# Patient Record
Sex: Female | Born: 1960 | Hispanic: No | Marital: Married | Smoking: Never smoker
Health system: Southern US, Community
[De-identification: ages and names within clinical notes are randomized; demographics above are authoritative.]

## PROBLEM LIST (undated history)

## (undated) DIAGNOSIS — I10 Essential (primary) hypertension: Secondary | ICD-10-CM

## (undated) DIAGNOSIS — E7800 Pure hypercholesterolemia, unspecified: Secondary | ICD-10-CM

## (undated) HISTORY — PX: EYE SURGERY: SHX253

---

## 2005-04-11 ENCOUNTER — Emergency Department (HOSPITAL_COMMUNITY): Admission: EM | Admit: 2005-04-11 | Discharge: 2005-04-11 | Payer: Self-pay | Admitting: Emergency Medicine

## 2012-08-07 ENCOUNTER — Other Ambulatory Visit: Payer: Self-pay | Admitting: Internal Medicine

## 2012-08-07 DIAGNOSIS — Z1231 Encounter for screening mammogram for malignant neoplasm of breast: Secondary | ICD-10-CM

## 2012-08-08 ENCOUNTER — Ambulatory Visit
Admission: RE | Admit: 2012-08-08 | Discharge: 2012-08-08 | Disposition: A | Discharge status: Home / Self Care | Payer: BC Managed Care – PPO | Source: Ambulatory Visit | Attending: Internal Medicine | Admitting: Internal Medicine

## 2012-08-08 DIAGNOSIS — Z1231 Encounter for screening mammogram for malignant neoplasm of breast: Secondary | ICD-10-CM

## 2013-02-01 DIAGNOSIS — R059 Cough, unspecified: Secondary | ICD-10-CM | POA: Insufficient documentation

## 2013-02-01 DIAGNOSIS — I10 Essential (primary) hypertension: Secondary | ICD-10-CM | POA: Insufficient documentation

## 2013-02-01 DIAGNOSIS — Z79899 Other long term (current) drug therapy: Secondary | ICD-10-CM | POA: Insufficient documentation

## 2013-02-01 DIAGNOSIS — N390 Urinary tract infection, site not specified: Secondary | ICD-10-CM | POA: Insufficient documentation

## 2013-02-01 DIAGNOSIS — R05 Cough: Secondary | ICD-10-CM | POA: Insufficient documentation

## 2013-02-01 DIAGNOSIS — R1110 Vomiting, unspecified: Secondary | ICD-10-CM | POA: Insufficient documentation

## 2013-02-01 NOTE — ED Notes (Signed)
Pt's family reports to EMS-fever, chills and vomited x 3 since 2300.  Pt is ambulatory.  Pt speaks [place], daughter translating.

## 2013-02-02 ENCOUNTER — Emergency Department (HOSPITAL_COMMUNITY): Payer: BC Managed Care – PPO

## 2013-02-02 ENCOUNTER — Encounter (HOSPITAL_COMMUNITY): Payer: Self-pay | Admitting: Emergency Medicine

## 2013-02-02 ENCOUNTER — Emergency Department (HOSPITAL_COMMUNITY)
Admission: EM | Admit: 2013-02-02 | Discharge: 2013-02-02 | Disposition: A | Discharge status: Home / Self Care | Payer: BC Managed Care – PPO | Attending: Emergency Medicine | Admitting: Emergency Medicine

## 2013-02-02 DIAGNOSIS — N390 Urinary tract infection, site not specified: Secondary | ICD-10-CM

## 2013-02-02 HISTORY — DX: Essential (primary) hypertension: I10

## 2013-02-02 LAB — COMPREHENSIVE METABOLIC PANEL
ALBUMIN: 4 g/dL (ref 3.5–5.2)
ALK PHOS: 97 U/L (ref 39–117)
ALT: 19 U/L (ref 0–35)
AST: 29 U/L (ref 0–37)
BUN: 14 mg/dL (ref 6–23)
CALCIUM: 9.1 mg/dL (ref 8.4–10.5)
CHLORIDE: 100 meq/L (ref 96–112)
CO2: 26 meq/L (ref 19–32)
CREATININE: 0.8 mg/dL (ref 0.50–1.10)
GFR calc Af Amer: >90 mL/min (ref >90)
GFR, EST NON AFRICAN AMERICAN: 81 mL/min — AB (ref >90)
GLUCOSE: 112 mg/dL — AB (ref 70–99)
POTASSIUM: 3.8 meq/L (ref 3.7–5.3)
SODIUM: 138 meq/L (ref 137–147)
TOTAL PROTEIN: 7.4 g/dL (ref 6.0–8.3)
Total Bilirubin: 0.4 mg/dL (ref 0.3–1.2)

## 2013-02-02 LAB — CBC WITH DIFFERENTIAL/PLATELET
BASOS ABS: 0 10*3/uL (ref 0.0–0.1)
BASOS PCT: 0 % (ref 0–1)
Eosinophils Absolute: 0.1 10*3/uL (ref 0.0–0.7)
Eosinophils Relative: 1 % (ref 0–5)
HCT: 36.3 % (ref 36.0–46.0)
HEMOGLOBIN: 12.6 g/dL (ref 12.0–15.0)
Lymphocytes Relative: 12 % (ref 12–46)
Lymphs Abs: 0.9 10*3/uL (ref 0.7–4.0)
MCH: 31 pg (ref 26.0–34.0)
MCHC: 34.7 g/dL (ref 30.0–36.0)
MCV: 89.2 fL (ref 78.0–100.0)
Monocytes Absolute: 0.1 10*3/uL (ref 0.1–1.0)
Monocytes Relative: 2 % — ABNORMAL LOW (ref 3–12)
Neutro Abs: 6.9 10*3/uL (ref 1.7–7.7)
Neutrophils Relative %: 86 % — ABNORMAL HIGH (ref 43–77)
PLATELETS: 234 10*3/uL (ref 150–400)
RBC: 4.07 MIL/uL (ref 3.87–5.11)
RDW: 12 % (ref 11.5–15.5)
WBC: 8.1 10*3/uL (ref 4.0–10.5)

## 2013-02-02 LAB — URINALYSIS, ROUTINE W REFLEX MICROSCOPIC
Bilirubin Urine: NEGATIVE
Glucose, UA: NEGATIVE mg/dL
HGB URINE DIPSTICK: NEGATIVE
Ketones, ur: NEGATIVE mg/dL
Nitrite: NEGATIVE
PROTEIN: NEGATIVE mg/dL
Specific Gravity, Urine: 1.006 (ref 1.005–1.030)
UROBILINOGEN UA: 0.2 mg/dL (ref 0.0–1.0)
pH: 7 (ref 5.0–8.0)

## 2013-02-02 LAB — URINE MICROSCOPIC-ADD ON

## 2013-02-02 MED ORDER — ACETAMINOPHEN 325 MG PO TABS
650.0000 mg | ORAL_TABLET | Freq: Once | ORAL | Status: AC
  Administered 2013-02-02: 650 mg via ORAL
  Filled 2013-02-02: qty 2

## 2013-02-02 MED ORDER — PANTOPRAZOLE SODIUM 40 MG IV SOLR
40.0000 mg | Freq: Once | INTRAVENOUS | Status: AC
  Administered 2013-02-02: 40 mg via INTRAVENOUS
  Filled 2013-02-02: qty 40

## 2013-02-02 MED ORDER — CEPHALEXIN 500 MG PO CAPS
1000.0000 mg | ORAL_CAPSULE | Freq: Once | ORAL | Status: AC
  Administered 2013-02-02: 1000 mg via ORAL
  Filled 2013-02-02: qty 2

## 2013-02-02 MED ORDER — SODIUM CHLORIDE 0.9 % IV BOLUS (SEPSIS)
1000.0000 mL | Freq: Once | INTRAVENOUS | Status: AC
  Administered 2013-02-02: 1000 mL via INTRAVENOUS

## 2013-02-02 MED ORDER — SODIUM CHLORIDE 0.9 % IV SOLN
1000.0000 mL | Freq: Once | INTRAVENOUS | Status: AC
  Administered 2013-02-02: 1000 mL via INTRAVENOUS

## 2013-02-02 MED ORDER — CEPHALEXIN 500 MG PO CAPS: 1000.0000 mg | ORAL_CAPSULE | Freq: Two times a day (BID) | ORAL | Status: DC

## 2013-02-02 MED ORDER — ONDANSETRON HCL 8 MG PO TABS: 8.0000 mg | ORAL_TABLET | Freq: Three times a day (TID) | ORAL | Status: DC | PRN

## 2013-02-02 MED ORDER — GI COCKTAIL ~~LOC~~
30.0000 mL | Freq: Once | ORAL | Status: AC
  Administered 2013-02-02: 30 mL via ORAL
  Filled 2013-02-02: qty 30

## 2013-02-02 NOTE — ED Notes (Signed)
Per family pt has not been able to keep anything down for 3 days.  Pt c/o headache and epigastric discomfort.

## 2013-02-02 NOTE — Discharge Instructions (Signed)
Take antibiotic as prescribed.  Take zofran as needed for nausea.  Treat pain and/or fever w/ motrin or tylenol.  You can alternate these two medications every three hours if necessary.  You should return to the ER if you develop fever, severe abdomen or low back pain or uncontrolled vomiting.

## 2013-02-02 NOTE — ED Provider Notes (Signed)
Medical screening examination/treatment/procedure(s) were performed by non-physician practitioner and as supervising physician I was immediately available for consultation/collaboration.  EKG Interpretation   None        Trinnity Breunig R. Mariama Saintvil, MD 02/02/13 0738 

## 2013-02-02 NOTE — ED Notes (Signed)
Bed: WA09 Expected date:  Expected time:  Means of arrival:  Comments: EMS 

## 2013-02-02 NOTE — ED Provider Notes (Signed)
CSN: [id number]     Arrival date & time 02/01/13  2358 History   First MD Initiated Contact with Patient 02/02/13 0023     Chief Complaint  Patient presents with  . fever,chills   . Emesis   (Consider location/radiation/quality/duration/timing/severity/associated sxs/prior Treatment) HPI History provided by pt and her daughter who is interpreting.  Pt presents w/ multiple complaints.  Onset N/V 3 days ago.  5 episodes day 1, 2-3 episodes day 2 and none yesterday.  Appetite returned yesterday and she was able to tolerate pos.  Woke this morning with fever and chills.  Associated w/ pain across lower abdomen as well as L low back, dysuria and nocturia.  Has also had a cough for 2-3 months, worst at night, that worsened yesterday.  Denies SOB but has pain in center of her chest w/ palpation.  Denies nasal congestion, rhinorrhea and sore throat.  Recent headache but no pain currently. Denies change in bowels and vaginal sx.  No known sick contacts.  H/o HTN. Past Medical History  Diagnosis Date  . Hypertension    History reviewed. No pertinent past surgical history. No family history on file. History  Substance Use Topics  . Smoking status: Never Smoker   . Smokeless tobacco: Not on file  . Alcohol Use: No   OB History   Grav Para Term Preterm Abortions TAB SAB Ect Mult Living                 Review of Systems  All other systems reviewed and are negative.    Allergies  Review of patient's allergies indicates no known allergies.  Home Medications   Current Outpatient Rx  Name  Route  Sig  Dispense  Refill  . acetaminophen (TYLENOL) 500 MG tablet   Oral   Take 500-1,000 mg by mouth every 6 (six) hours as needed for mild pain or headache.         . bismuth subsalicylate (PEPTO BISMOL) 262 MG/15ML suspension   Oral   Take 30 mLs by mouth every 6 (six) hours as needed for indigestion or diarrhea or loose stools.         . calcium carbonate (TUMS - DOSED IN MG ELEMENTAL  CALCIUM) 500 MG chewable tablet   Oral   Chew 1 tablet by mouth 3 (three) times daily as needed for indigestion or heartburn.         Marland Kitchen lisinopril (PRINIVIL,ZESTRIL) 20 MG tablet   Oral   Take 20 mg by mouth every evening.          BP 146/83  Pulse 96  Temp(Src) 102.6 F (39.2 C) (Rectal)  Resp 20  SpO2 93% Physical Exam  Nursing note and vitals reviewed. Constitutional: She is oriented to person, place, and time. She appears well-developed and well-nourished. No distress.  febrile  HENT:  Head: Normocephalic and atraumatic.  Eyes:  Normal appearance  Neck: Normal range of motion.  No meningismus   Cardiovascular: Normal rate and regular rhythm.   Pulmonary/Chest: Effort normal and breath sounds normal. No respiratory distress.  Abdominal: Soft. Bowel sounds are normal. She exhibits no distension and no mass. There is no rebound and no guarding.  Mild epigastric and moderate, diffuse lower abd ttp.    Genitourinary:  L CVA tenderness  Musculoskeletal: Normal range of motion.  Neurological: She is alert and oriented to person, place, and time.  CN 3-12 intact.  No sensory deficits.  5/5 and equal upper and lower extremity strength.  No past pointing.   Skin: Skin is warm and dry. No rash noted.  Psychiatric: She has a normal mood and affect. Her behavior is normal.    ED Course  Procedures (including critical care time) Labs Review Labs Reviewed  CBC WITH DIFFERENTIAL - Abnormal; Notable for the following:    Neutrophils Relative % 86 (*)    Monocytes Relative 2 (*)    All other components within normal limits  COMPREHENSIVE METABOLIC PANEL - Abnormal; Notable for the following:    Glucose, Bld 112 (*)    GFR calc non Af Amer 81 (*)    All other components within normal limits  URINALYSIS, ROUTINE W REFLEX MICROSCOPIC   Imaging Review No results found.  EKG Interpretation   None       MDM  No diagnosis found. 53yo F presents w/ f/c, vomiting, lower  abd pain, urinary sx and acutely worsened chronic cough.  On exam, febrile, elevated HR, non-toxic appearance, nml breath sounds, abd soft/non-distended, mild epigastric and moderate, diffuse lower abd ttp, L flank ttp, no focal neuro deficits or meningismus.  Labs unremarkable w/ exception of U/A which is pending.  CXR ordered to r/o PNA.  Pt to receive 2nd liter bolus NS as well as GI cocktail and protonix.  She has had tylenol for fever.  1:38 AM   CXR w/out infiltrate or other abnormality. U/A shows likely infection and have some concern for pyelo based on L CVA tenderness, recent vomiting and fever.  Pt reports feeling better.  VS have improved. She received first dose of keflex in ED and d/c'd home w/ same + zofran.  Recommended rest and fluids.  Return precautions discussed.     Otilio Miuatherine E Teralyn Mullins, PA-C 02/02/13 [phone]

## 2013-02-04 LAB — URINE CULTURE

## 2013-04-26 ENCOUNTER — Emergency Department (HOSPITAL_COMMUNITY)
Admission: EM | Admit: 2013-04-26 | Discharge: 2013-04-26 | Disposition: A | Discharge status: Home / Self Care | Payer: BC Managed Care – PPO | Attending: Emergency Medicine | Admitting: Emergency Medicine

## 2013-04-26 ENCOUNTER — Encounter (HOSPITAL_COMMUNITY): Payer: Self-pay | Admitting: Emergency Medicine

## 2013-04-26 ENCOUNTER — Emergency Department (HOSPITAL_COMMUNITY): Payer: BC Managed Care – PPO

## 2013-04-26 DIAGNOSIS — I10 Essential (primary) hypertension: Secondary | ICD-10-CM

## 2013-04-26 DIAGNOSIS — Z79899 Other long term (current) drug therapy: Secondary | ICD-10-CM | POA: Insufficient documentation

## 2013-04-26 DIAGNOSIS — J3489 Other specified disorders of nose and nasal sinuses: Secondary | ICD-10-CM | POA: Insufficient documentation

## 2013-04-26 DIAGNOSIS — R51 Headache: Secondary | ICD-10-CM | POA: Insufficient documentation

## 2013-04-26 DIAGNOSIS — R519 Headache, unspecified: Secondary | ICD-10-CM

## 2013-04-26 DIAGNOSIS — Z792 Long term (current) use of antibiotics: Secondary | ICD-10-CM | POA: Insufficient documentation

## 2013-04-26 LAB — BASIC METABOLIC PANEL
BUN: 13 mg/dL (ref 6–23)
CHLORIDE: 99 meq/L (ref 96–112)
CO2: 24 mEq/L (ref 19–32)
Calcium: 9.4 mg/dL (ref 8.4–10.5)
Creatinine, Ser: 0.65 mg/dL (ref 0.50–1.10)
GFR calc non Af Amer: >90 mL/min (ref >90)
Glucose, Bld: 104 mg/dL — ABNORMAL HIGH (ref 70–99)
Potassium: 3.7 mEq/L (ref 3.7–5.3)
SODIUM: 136 meq/L — AB (ref 137–147)

## 2013-04-26 LAB — CBC
HEMATOCRIT: 37.5 % (ref 36.0–46.0)
Hemoglobin: 12.9 g/dL (ref 12.0–15.0)
MCH: 30.6 pg (ref 26.0–34.0)
MCHC: 34.4 g/dL (ref 30.0–36.0)
MCV: 89.1 fL (ref 78.0–100.0)
Platelets: 225 10*3/uL (ref 150–400)
RBC: 4.21 MIL/uL (ref 3.87–5.11)
RDW: 12.4 % (ref 11.5–15.5)
WBC: 4.8 10*3/uL (ref 4.0–10.5)

## 2013-04-26 MED ORDER — TRAMADOL HCL 50 MG PO TABS
50.0000 mg | ORAL_TABLET | Freq: Once | ORAL | Status: AC
  Administered 2013-04-26: 50 mg via ORAL
  Filled 2013-04-26: qty 1

## 2013-04-26 MED ORDER — LISINOPRIL 20 MG PO TABS: 20.0000 mg | ORAL_TABLET | Freq: Every day | ORAL | Status: AC

## 2013-04-26 MED ORDER — LISINOPRIL 20 MG PO TABS
20.0000 mg | ORAL_TABLET | Freq: Once | ORAL | Status: AC
  Administered 2013-04-26: 20 mg via ORAL
  Filled 2013-04-26: qty 1

## 2013-04-26 MED ORDER — ONDANSETRON HCL 4 MG/2ML IJ SOLN: 4.0000 mg | Freq: Once | INTRAMUSCULAR | Status: DC

## 2013-04-26 NOTE — ED Notes (Signed)
Patient was showering, sudden onset headache and neck tension. Family checked BP at home SBP 200, 190, then 170 at home. 200/100 on arrival.

## 2013-04-26 NOTE — ED Notes (Signed)
Patient transported to CT 

## 2013-04-26 NOTE — ED Provider Notes (Addendum)
CSN: [id number]     Arrival date & time 04/26/13  2134 History   First MD Initiated Contact with Patient 04/26/13 2157     Chief Complaint  Patient presents with  . Hypertension    SBP 200, 190, 170 at home  . Headache     (Consider location/radiation/quality/duration/timing/severity/associated sxs/prior Treatment) Patient is a 53 y.o. female presenting with hypertension and headaches. The history is provided by the patient and a relative. A language interpreter was used.  Hypertension Associated symptoms include headaches. Pertinent negatives include no chest pain, no abdominal pain and no shortness of breath.  Headache Associated symptoms: congestion   Associated symptoms: no abdominal pain, no back pain, no cough, no fever, no nausea, no neck pain, no numbness, no sinus pressure and no vomiting   pt with hx htn, states missed her normal bp med dose today, and checked bp this evening and was high, in range 200/ systolic. Pt also noted headache onset tonight, acute onset, mild-moderate at onset. Located superiorly. Dull. Moderate currently. Similar to prior headaches, ?remote hx migraines. Does not get frequent headaches. No neck stiffness/rigidity. No eye pain or change in vision. No change in speech. No problems w balance or coordination. No nv. No associated numbness/weakness. +recent allergy symptoms. No purulent nasal drainage, or sinus pressure. No fever or chills.      Past Medical History  Diagnosis Date  . Hypertension    History reviewed. No pertinent past surgical history. No family history on file. History  Substance Use Topics  . Smoking status: Never Smoker   . Smokeless tobacco: Not on file  . Alcohol Use: No   OB History   Grav Para Term Preterm Abortions TAB SAB Ect Mult Living                 Review of Systems  Constitutional: Negative for fever and chills.  HENT: Positive for congestion. Negative for sinus pressure.   Eyes: Negative for redness.   Respiratory: Negative for cough and shortness of breath.   Cardiovascular: Negative for chest pain.  Gastrointestinal: Negative for nausea, vomiting and abdominal pain.  Genitourinary: Negative for flank pain.  Musculoskeletal: Negative for back pain and neck pain.  Skin: Negative for rash.  Neurological: Positive for headaches. Negative for syncope, weakness and numbness.  Hematological: Does not bruise/bleed easily.  Psychiatric/Behavioral: Negative for confusion.      Allergies  Review of patient's allergies indicates no known allergies.  Home Medications   Prior to Admission medications   Medication Sig Start Date End Date Taking? Authorizing Provider  acetaminophen (TYLENOL) 500 MG tablet Take 500-1,000 mg by mouth every 6 (six) hours as needed for mild pain or headache.    Historical Provider, MD  bismuth subsalicylate (PEPTO BISMOL) 262 MG/15ML suspension Take 30 mLs by mouth every 6 (six) hours as needed for indigestion or diarrhea or loose stools.    Historical Provider, MD  calcium carbonate (TUMS - DOSED IN MG ELEMENTAL CALCIUM) 500 MG chewable tablet Chew 1 tablet by mouth 3 (three) times daily as needed for indigestion or heartburn.    Historical Provider, MD  cephALEXin (KEFLEX) 500 MG capsule Take 2 capsules (1,000 mg total) by mouth 2 (two) times daily. 02/02/13   Arie Sabina Schinlever, PA-C  lisinopril (PRINIVIL,ZESTRIL) 20 MG tablet Take 20 mg by mouth every evening.    Historical Provider, MD  ondansetron (ZOFRAN) 8 MG tablet Take 1 tablet (8 mg total) by mouth every 8 (eight) hours as needed  for nausea or vomiting. 02/02/13   Arie Sabinaatherine E Schinlever, PA-C   BP 166/89  Pulse 55  Temp(Src) 97.5 F (36.4 C) (Oral)  Resp 14  Ht 5\' 3"  (1.6 m)  Wt 135 lb (61.236 kg)  BMI 23.92 kg/m2  SpO2 100% Physical Exam  Nursing note and vitals reviewed. Constitutional: She is oriented to person, place, and time. She appears well-developed and well-nourished. No distress.  HENT:   Head: Atraumatic.  Nose: Nose normal.  Mouth/Throat: Oropharynx is clear and moist.  No sinus or temporal tenderness.  Eyes: Conjunctivae and EOM are normal. Pupils are equal, round, and reactive to light. No scleral icterus.  Neck: Neck supple. No tracheal deviation present. No thyromegaly present.  No stiffness or rigidity.   Cardiovascular: Normal rate, regular rhythm, normal heart sounds and intact distal pulses.  Exam reveals no gallop and no friction rub.   No murmur heard. Pulmonary/Chest: Effort normal and breath sounds normal. No respiratory distress.  Abdominal: Soft. Normal appearance and bowel sounds are normal. She exhibits no distension. There is no tenderness.  Genitourinary:  No cva tenderness.  Musculoskeletal: Normal range of motion. She exhibits no edema and no tenderness.  Neurological: She is alert and oriented to person, place, and time. No cranial nerve deficit.  Motor intact bilaterally. Steady gait.   Skin: Skin is warm and dry. No rash noted. She is not diaphoretic.  Psychiatric: She has a normal mood and affect.    ED Course  Procedures (including critical care time)   Results for orders placed during the hospital encounter of 04/26/13  CBC      Result Value Ref Range   WBC 4.8  4.0 - 10.5 K/uL   RBC 4.21  3.87 - 5.11 MIL/uL   Hemoglobin 12.9  12.0 - 15.0 g/dL   HCT 78.237.5  95.636.0 - 21.346.0 %   MCV 89.1  78.0 - 100.0 fL   MCH 30.6  26.0 - 34.0 pg   MCHC 34.4  30.0 - 36.0 g/dL   RDW 08.612.4  57.811.5 - 46.915.5 %   Platelets 225  150 - 400 K/uL  BASIC METABOLIC PANEL      Result Value Ref Range   Sodium 136 (*) 137 - 147 mEq/L   Potassium 3.7  3.7 - 5.3 mEq/L   Chloride 99  96 - 112 mEq/L   CO2 24  19 - 32 mEq/L   Glucose, Bld 104 (*) 70 - 99 mg/dL   BUN 13  6 - 23 mg/dL   Creatinine, Ser 6.290.65  0.50 - 1.10 mg/dL   Calcium 9.4  8.4 - 52.810.5 mg/dL   GFR calc non Af Amer >90  >90 mL/min   GFR calc Af Amer >90  >90 mL/min   Ct Head Wo Contrast  04/26/2013    CLINICAL DATA:  Sudden onset headache  EXAM: CT HEAD WITHOUT CONTRAST  TECHNIQUE: Contiguous axial images were obtained from the base of the skull through the vertex without intravenous contrast.  COMPARISON:  Prior CT from 04/11/2005.  FINDINGS: There is no acute intracranial hemorrhage or infarct. No mass lesion or midline shift. Gray-white matter differentiation is well maintained. Ventricles are normal in size without evidence of hydrocephalus. CSF containing spaces are within normal limits. No extra-axial fluid collection.  The calvarium is intact.  Orbital soft tissues are within normal limits.  The paranasal sinuses and mastoid air cells are well pneumatized and free of fluid.  Scalp soft tissues are unremarkable.  IMPRESSION: Normal  head CT with no acute intracranial process identified.   Electronically Signed   By: Rise MuBenjamin  McClintock M.D.   On: 04/26/2013 22:44        EKG Interpretation   Date/Time:  Saturday April 26 2013 22:07:14 EDT Ventricular Rate:  55 PR Interval:  145 QRS Duration: 102 QT Interval:  433 QTC Calculation: 414 R Axis:   45 Text Interpretation:  Sinus rhythm No previous tracing Confirmed by Denton LankSTEINL   MD, Caryn BeeKEVIN ([id number]) on 04/26/2013 10:12:31 PM      MDM  Pt missed her normal bp med today. Recheck bp high. Will give her normal medication dose.  Reviewed nursing notes and prior charts for additional history.   Ultram po.  Recheck pt comfortable. bp improved.   Headache improved/resolved. bp improved. Ct neg. Pt appears stable for d/c.   Pt/family states has a few of her bp pills at home, but almost out, requests refill.      Suzi RootsKevin E Rynell Ciotti, MD 04/26/13 [phone]

## 2013-04-26 NOTE — Discharge Instructions (Signed)
Continue your blood pressure medication as prescribed. Take tylenol/motrin as need.  Follow up with primary care doctor in coming week for recheck/recheck of blood pressure. Return to ER if worse, severe headache, persistent vomiting, other concern.  You were given pain medication in the ER - no driving for the next 4 hours    Hypertension As your heart beats, it forces blood through your arteries. This force is your blood pressure. If the pressure is too high, it is called hypertension (HTN) or high blood pressure. HTN is dangerous because you may have it and not know it. High blood pressure may mean that your heart has to work harder to pump blood. Your arteries may be narrow or stiff. The extra work puts you at risk for heart disease, stroke, and other problems.  Blood pressure consists of two numbers, a higher number over a lower, 110/72, for example. It is stated as "110 over 72." The ideal is below 120 for the top number (systolic) and under 80 for the bottom (diastolic). Write down your blood pressure today. You should pay close attention to your blood pressure if you have certain conditions such as:  Heart failure.  Prior heart attack.  Diabetes  Chronic kidney disease.  Prior stroke.  Multiple risk factors for heart disease. To see if you have HTN, your blood pressure should be measured while you are seated with your arm held at the level of the heart. It should be measured at least twice. A one-time elevated blood pressure reading (especially in the Emergency Department) does not mean that you need treatment. There may be conditions in which the blood pressure is different between your right and left arms. It is important to see your caregiver soon for a recheck. Most people have essential hypertension which means that there is not a specific cause. This type of high blood pressure may be lowered by changing lifestyle factors such as:  Stress.  Smoking.  Lack of  exercise.  Excessive weight.  Drug/tobacco/alcohol use.  Eating less salt. Most people do not have symptoms from high blood pressure until it has caused damage to the body. Effective treatment can often prevent, delay or reduce that damage. TREATMENT  When a cause has been identified, treatment for high blood pressure is directed at the cause. There are a large number of medications to treat HTN. These fall into several categories, and your caregiver will help you select the medicines that are best for you. Medications may have side effects. You should review side effects with your caregiver. If your blood pressure stays high after you have made lifestyle changes or started on medicines,   Your medication(s) may need to be changed.  Other problems may need to be addressed.  Be certain you understand your prescriptions, and know how and when to take your medicine.  Be sure to follow up with your caregiver within the time frame advised (usually within two weeks) to have your blood pressure rechecked and to review your medications.  If you are taking more than one medicine to lower your blood pressure, make sure you know how and at what times they should be taken. Taking two medicines at the same time can result in blood pressure that is too low. SEEK IMMEDIATE MEDICAL CARE IF:  You develop a severe headache, blurred or changing vision, or confusion.  You have unusual weakness or numbness, or a faint feeling.  You have severe chest or abdominal pain, vomiting, or breathing problems. MAKE SURE  YOU:   Understand these instructions.  Will watch your condition.  Will get help right away if you are not doing well or get worse. Document Released: 12/19/2004 Document Revised: 03/13/2011 Document Reviewed: 08/09/2007 [facility] Patient Information 2014 [place], [place].    Headaches, Frequently Asked Questions MIGRAINE HEADACHES Q: What is migraine? What causes it? How can I treat it? A:  Generally, migraine headaches begin as a dull ache. Then they develop into a constant, throbbing, and pulsating pain. You may experience pain at the temples. You may experience pain at the front or back of one or both sides of the head. The pain is usually accompanied by a combination of:  Nausea.  Vomiting.  Sensitivity to light and noise. Some people (about 15%) experience an aura (see below) before an attack. The cause of migraine is believed to be chemical reactions in the brain. Treatment for migraine may include over-the-counter or prescription medications. It may also include self-help techniques. These include relaxation training and biofeedback.  Q: What is an aura? A: About 15% of people with migraine get an "aura". This is a sign of neurological symptoms that occur before a migraine headache. You may see wavy or jagged lines, dots, or flashing lights. You might experience tunnel vision or blind spots in one or both eyes. The aura can include visual or auditory hallucinations (something imagined). It may include disruptions in smell (such as strange odors), taste or touch. Other symptoms include:  Numbness.  A "pins and needles" sensation.  Difficulty in recalling or speaking the correct word. These neurological events may last as long as 60 minutes. These symptoms will fade as the headache begins. Q: What is a trigger? A: Certain physical or environmental factors can lead to or "trigger" a migraine. These include:  Foods.  Hormonal changes.  Weather.  Stress. It is important to remember that triggers are different for everyone. To help prevent migraine attacks, you need to figure out which triggers affect you. Keep a headache diary. This is a good way to track triggers. The diary will help you talk to your healthcare professional about your condition. Q: Does weather affect migraines? A: Bright sunshine, hot, humid conditions, and drastic changes in barometric pressure may  lead to, or "trigger," a migraine attack in some people. But studies have shown that weather does not act as a trigger for everyone with migraines. Q: What is the link between migraine and hormones? A: Hormones start and regulate many of your body's functions. Hormones keep your body in balance within a constantly changing environment. The levels of hormones in your body are unbalanced at times. Examples are during menstruation, pregnancy, or menopause. That can lead to a migraine attack. In fact, about three quarters of all women with migraine report that their attacks are related to the menstrual cycle.  Q: Is there an increased risk of stroke for migraine sufferers? A: The likelihood of a migraine attack causing a stroke is very remote. That is not to say that migraine sufferers cannot have a stroke associated with their migraines. In persons under age 55, the most common associated factor for stroke is migraine headache. But over the course of a person's normal life span, the occurrence of migraine headache may actually be associated with a reduced risk of dying from cerebrovascular disease due to stroke.  Q: What are acute medications for migraine? A: Acute medications are used to treat the pain of the headache after it has started. Examples over-the-counter medications, NSAIDs, ergots,  and triptans.  Q: What are the triptans? A: Triptans are the newest class of abortive medications. They are specifically targeted to treat migraine. Triptans are vasoconstrictors. They moderate some chemical reactions in the brain. The triptans work on receptors in your brain. Triptans help to restore the balance of a neurotransmitter called serotonin. Fluctuations in levels of serotonin are thought to be a main cause of migraine.  Q: Are over-the-counter medications for migraine effective? A: Over-the-counter, or "OTC," medications may be effective in relieving mild to moderate pain and associated symptoms of  migraine. But you should see your caregiver before beginning any treatment regimen for migraine.  Q: What are preventive medications for migraine? A: Preventive medications for migraine are sometimes referred to as "prophylactic" treatments. They are used to reduce the frequency, severity, and length of migraine attacks. Examples of preventive medications include antiepileptic medications, antidepressants, beta-blockers, calcium channel blockers, and NSAIDs (nonsteroidal anti-inflammatory drugs). Q: Why are anticonvulsants used to treat migraine? A: During the past few years, there has been an increased interest in antiepileptic drugs for the prevention of migraine. They are sometimes referred to as "anticonvulsants". Both epilepsy and migraine may be caused by similar reactions in the brain.  Q: Why are antidepressants used to treat migraine? A: Antidepressants are typically used to treat people with depression. They may reduce migraine frequency by regulating chemical levels, such as serotonin, in the brain.  Q: What alternative therapies are used to treat migraine? A: The term "alternative therapies" is often used to describe treatments considered outside the scope of conventional Western medicine. Examples of alternative therapy include acupuncture, acupressure, and yoga. Another common alternative treatment is herbal therapy. Some herbs are believed to relieve headache pain. Always discuss alternative therapies with your caregiver before proceeding. Some herbal products contain arsenic and other toxins. TENSION HEADACHES Q: What is a tension-type headache? What causes it? How can I treat it? A: Tension-type headaches occur randomly. They are often the result of temporary stress, anxiety, fatigue, or anger. Symptoms include soreness in your temples, a tightening band-like sensation around your head (a "vice-like" ache). Symptoms can also include a pulling feeling, pressure sensations, and contracting  head and neck muscles. The headache begins in your forehead, temples, or the back of your head and neck. Treatment for tension-type headache may include over-the-counter or prescription medications. Treatment may also include self-help techniques such as relaxation training and biofeedback. CLUSTER HEADACHES Q: What is a cluster headache? What causes it? How can I treat it? A: Cluster headache gets its name because the attacks come in groups. The pain arrives with little, if any, warning. It is usually on one side of the head. A tearing or bloodshot eye and a runny nose on the same side of the headache may also accompany the pain. Cluster headaches are believed to be caused by chemical reactions in the brain. They have been described as the most severe and intense of any headache type. Treatment for cluster headache includes prescription medication and oxygen. SINUS HEADACHES Q: What is a sinus headache? What causes it? How can I treat it? A: When a cavity in the bones of the face and skull (a sinus) becomes inflamed, the inflammation will cause localized pain. This condition is usually the result of an allergic reaction, a tumor, or an infection. If your headache is caused by a sinus blockage, such as an infection, you will probably have a fever. An x-ray will confirm a sinus blockage. Your caregiver's treatment might include antibiotics  for the infection, as well as antihistamines or decongestants.  REBOUND HEADACHES Q: What is a rebound headache? What causes it? How can I treat it? A: A pattern of taking acute headache medications too often can lead to a condition known as "rebound headache." A pattern of taking too much headache medication includes taking it more than 2 days per week or in excessive amounts. That means more than the label or a caregiver advises. With rebound headaches, your medications not only stop relieving pain, they actually begin to cause headaches. Doctors treat rebound headache  by tapering the medication that is being overused. Sometimes your caregiver will gradually substitute a different type of treatment or medication. Stopping may be a challenge. Regularly overusing a medication increases the potential for serious side effects. Consult a caregiver if you regularly use headache medications more than 2 days per week or more than the label advises. ADDITIONAL QUESTIONS AND ANSWERS Q: What is biofeedback? A: Biofeedback is a self-help treatment. Biofeedback uses special equipment to monitor your body's involuntary physical responses. Biofeedback monitors:  Breathing.  Pulse.  Heart rate.  Temperature.  Muscle tension.  Brain activity. Biofeedback helps you refine and perfect your relaxation exercises. You learn to control the physical responses that are related to stress. Once the technique has been mastered, you do not need the equipment any more. Q: Are headaches hereditary? A: Four out of five (80%) of people that suffer report a family history of migraine. Scientists are not sure if this is genetic or a family predisposition. Despite the uncertainty, a child has a 50% chance of having migraine if one parent suffers. The child has a 75% chance if both parents suffer.  Q: Can children get headaches? A: By the time they reach high school, most young people have experienced some type of headache. Many safe and effective approaches or medications can prevent a headache from occurring or stop it after it has begun.  Q: What type of doctor should I see to diagnose and treat my headache? A: Start with your primary caregiver. Discuss his or her experience and approach to headaches. Discuss methods of classification, diagnosis, and treatment. Your caregiver may decide to recommend you to a headache specialist, depending upon your symptoms or other physical conditions. Having diabetes, allergies, etc., may require a more comprehensive and inclusive approach to your headache.  The [employer] will provide, upon request, a list of [facility] physician members in your state. Document Released: 03/11/2003 Document Revised: 03/13/2011 Document Reviewed: 08/19/2007 [facility] Patient Information 2014 [place], [place].

## 2013-04-26 NOTE — ED Notes (Signed)
Pt returned to room from CT

## 2013-04-26 NOTE — ED Notes (Signed)
Called pharmacy to send Lisinopril

## 2013-09-15 ENCOUNTER — Other Ambulatory Visit: Payer: Self-pay | Admitting: Internal Medicine

## 2013-09-15 DIAGNOSIS — Z1231 Encounter for screening mammogram for malignant neoplasm of breast: Secondary | ICD-10-CM

## 2013-09-22 ENCOUNTER — Ambulatory Visit: Payer: BC Managed Care – PPO

## 2013-11-07 ENCOUNTER — Ambulatory Visit (INDEPENDENT_AMBULATORY_CARE_PROVIDER_SITE_OTHER): Payer: BC Managed Care – PPO | Admitting: Family Medicine

## 2013-11-07 VITALS — BP 124/74 | HR 57 | Temp 97.9°F | Resp 16 | Ht 62.5 in | Wt 138.4 lb

## 2013-11-07 DIAGNOSIS — R05 Cough: Secondary | ICD-10-CM

## 2013-11-07 DIAGNOSIS — R079 Chest pain, unspecified: Secondary | ICD-10-CM

## 2013-11-07 DIAGNOSIS — B349 Viral infection, unspecified: Secondary | ICD-10-CM

## 2013-11-07 DIAGNOSIS — R519 Headache, unspecified: Secondary | ICD-10-CM

## 2013-11-07 DIAGNOSIS — R059 Cough, unspecified: Secondary | ICD-10-CM

## 2013-11-07 DIAGNOSIS — M791 Myalgia, unspecified site: Secondary | ICD-10-CM

## 2013-11-07 DIAGNOSIS — R42 Dizziness and giddiness: Secondary | ICD-10-CM

## 2013-11-07 DIAGNOSIS — R51 Headache: Secondary | ICD-10-CM

## 2013-11-07 DIAGNOSIS — L299 Pruritus, unspecified: Secondary | ICD-10-CM

## 2013-11-07 LAB — POCT CBC
GRANULOCYTE PERCENT: 73.5 % (ref 37–80)
HEMATOCRIT: 42.4 % (ref 37.7–47.9)
HEMOGLOBIN: 14.3 g/dL (ref 12.2–16.2)
LYMPH, POC: 1.4 (ref 0.6–3.4)
MCH, POC: 30.7 pg (ref 27–31.2)
MCHC: 33.9 g/dL (ref 31.8–35.4)
MCV: 90.6 fL (ref 80–97)
MID (cbc): 0.4 (ref 0–0.9)
MPV: 6.8 fL (ref 0–99.8)
POC GRANULOCYTE: 5 (ref 2–6.9)
POC LYMPH %: 21.2 % (ref 10–50)
POC MID %: 5.3 % (ref 0–12)
Platelet Count, POC: 252 10*3/uL (ref 142–424)
RBC: 4.68 M/uL (ref 4.04–5.48)
RDW, POC: 12.6 %
WBC: 6.8 10*3/uL (ref 4.6–10.2)

## 2013-11-07 LAB — POCT INFLUENZA A/B
INFLUENZA B, POC: NEGATIVE
Influenza A, POC: NEGATIVE

## 2013-11-07 MED ORDER — BENZONATATE 100 MG PO CAPS: 100.0000 mg | ORAL_CAPSULE | Freq: Three times a day (TID) | ORAL | Status: DC | PRN

## 2013-11-07 NOTE — Patient Instructions (Addendum)
Your itching may be due to the hydrocodone in the cough medicine. Stop that medicine for now, tessalon perles if needed for cough, and if you do use the hydrocodone again in the future and it causes itching - avoid use of this class of medicines in the future.   Your blood test and flu test were normal. I suspect you have a viral illness, that should improve in next 5-7 days. Tylenol if needed for body aches and headaches, mucinex or tessalon perles for cough. Drink plenty of fluids, and if needed - can take zyrtec over the counter once per day for itching.   Your chest soreness appears to be due to muscles/chest wall, but if this worsens - return here or emergency room.   If dizziness does not improve in next 2 days with increasing your fluids - can return to look at other causes of this as well.   Return to the clinic or go to the nearest emergency room if any of your symptoms worsen or new symptoms occur.   Viral Infections A virus is a type of germ. Viruses can cause:  Minor sore throats.  Aches and pains.  Headaches.  Runny nose.  Rashes.  Watery eyes.  Tiredness.  Coughs.  Loss of appetite.  Feeling sick to your stomach (nausea).  Throwing up (vomiting).  Watery poop (diarrhea). HOME CARE   Only take medicines as told by your doctor.  Drink enough water and fluids to keep your pee (urine) clear or pale yellow. Sports drinks are a good choice.  Get plenty of rest and eat healthy. Soups and broths with crackers or rice are fine. GET HELP RIGHT AWAY IF:   You have a very bad headache.  You have shortness of breath.  You have chest pain or neck pain.  You have an unusual rash.  You cannot stop throwing up.  You have watery poop that does not stop.  You cannot keep fluids down.  You or your child has a temperature by mouth above 102 F (38.9 C), not controlled by medicine.  Your baby is older than 3 months with a rectal temperature of 102 F (38.9 C)  or higher.  Your baby is 8 months old or younger with a rectal temperature of 100.4 F (38 C) or higher. MAKE SURE YOU:   Understand these instructions.  Will watch this condition.  Will get help right away if you are not doing well or get worse. Document Released: 12/02/2007 Document Revised: 03/13/2011 Document Reviewed: 04/26/2010 [facility] Patient Information 2015 [place], [place]. This information is not intended to replace advice given to you by your health care provider. Make sure you discuss any questions you have with your health care provider.  Cough, Adult  A cough is a reflex that helps clear your throat and airways. It can help heal the body or may be a reaction to an irritated airway. A cough may only last 2 or 3 weeks (acute) or may last more than 8 weeks (chronic).  CAUSES Acute cough:  Viral or bacterial infections. Chronic cough:  Infections.  Allergies.  Asthma.  Post-nasal drip.  Smoking.  Heartburn or acid reflux.  Some medicines.  Chronic lung problems (COPD).  Cancer. SYMPTOMS   Cough.  Fever.  Chest pain.  Increased breathing rate.  High-pitched whistling sound when breathing (wheezing).  Colored mucus that you cough up (sputum). TREATMENT   A bacterial cough may be treated with antibiotic medicine.  A viral cough must run  its course and will not respond to antibiotics.  Your caregiver may recommend other treatments if you have a chronic cough. HOME CARE INSTRUCTIONS   Only take over-the-counter or prescription medicines for pain, discomfort, or fever as directed by your caregiver. Use cough suppressants only as directed by your caregiver.  Use a cold steam vaporizer or humidifier in your bedroom or home to help loosen secretions.  Sleep in a semi-upright position if your cough is worse at night.  Rest as needed.  Stop smoking if you smoke. SEEK IMMEDIATE MEDICAL CARE IF:   You have pus in your sputum.  Your cough starts  to worsen.  You cannot control your cough with suppressants and are losing sleep.  You begin coughing up blood.  You have difficulty breathing.  You develop pain which is getting worse or is uncontrolled with medicine.  You have a fever. MAKE SURE YOU:   Understand these instructions.  Will watch your condition.  Will get help right away if you are not doing well or get worse. Document Released: 06/17/2010 Document Revised: 03/13/2011 Document Reviewed: 06/17/2010 [facility] Patient Information 2015 [place], [place]. This information is not intended to replace advice given to you by your health care provider. Make sure you discuss any questions you have with your health care provider.  Dizziness Dizziness is a common problem. It is a feeling of unsteadiness or light-headedness. You may feel like you are about to faint. Dizziness can lead to injury if you stumble or fall. A person of any age group can suffer from dizziness, but dizziness is more common in older adults. CAUSES  Dizziness can be caused by many different things, including:  Middle ear problems.  Standing for too long.  Infections.  An allergic reaction.  Aging.  An emotional response to something, such as the sight of blood.  Side effects of medicines.  Tiredness.  Problems with circulation or blood pressure.  Excessive use of alcohol or medicines, or illegal drug use.  Breathing too fast (hyperventilation).  An irregular heart rhythm (arrhythmia).  A low red blood cell count (anemia).  Pregnancy.  Vomiting, diarrhea, fever, or other illnesses that cause body fluid loss (dehydration).  Diseases or conditions such as Parkinson's disease, high blood pressure (hypertension), diabetes, and thyroid problems.  Exposure to extreme heat. DIAGNOSIS  Your health care provider will ask about your symptoms, perform a physical exam, and perform an electrocardiogram (ECG) to record the electrical activity of  your heart. Your health care provider may also perform other heart or blood tests to determine the cause of your dizziness. These may include:  Transthoracic echocardiogram (TTE). During echocardiography, sound waves are used to evaluate how blood flows through your heart.  Transesophageal echocardiogram (TEE).  Cardiac monitoring. This allows your health care provider to monitor your heart rate and rhythm in real time.  Holter monitor. This is a portable device that records your heartbeat and can help diagnose heart arrhythmias. It allows your health care provider to track your heart activity for several days if needed.  Stress tests by exercise or by giving medicine that makes the heart beat faster.  Chest Wall Pain Chest wall pain is pain in or around the bones and muscles of your chest. It may take up to 6 weeks to get better. It may take longer if you must stay physically active in your work and activities.  CAUSES  Chest wall pain may happen on its own. However, it may be caused by:  A  viral illness like the flu.  Injury.  Coughing.  Exercise.  Arthritis.  Fibromyalgia.  Shingles. HOME CARE INSTRUCTIONS   Avoid overtiring physical activity. Try not to strain or perform activities that cause pain. This includes any activities using your chest or your abdominal and side muscles, especially if heavy weights are used.  Put ice on the sore area.  Put ice in a plastic bag.  Place a towel between your skin and the bag.  Leave the ice on for 15-20 minutes per hour while awake for the first 2 days.  Only take over-the-counter or prescription medicines for pain, discomfort, or fever as directed by your caregiver. SEEK IMMEDIATE MEDICAL CARE IF:   Your pain increases, or you are very uncomfortable.  You have a fever.  Your chest pain becomes worse.  You have new, unexplained symptoms.  You have nausea or vomiting.  You feel sweaty or lightheaded.  You have a cough  with phlegm (sputum), or you cough up blood. MAKE SURE YOU:   Understand these instructions.  Will watch your condition.  Will get help right away if you are not doing well or get worse. Document Released: 12/19/2004 Document Revised: 03/13/2011 Document Reviewed: 08/15/2010 [facility] Patient Information 2015 [place], [place]. This information is not intended to replace advice given to you by your health care provider. Make sure you discuss any questions you have with your health care provider. TREATMENT  Treatment of dizziness depends on the cause of your symptoms and can vary greatly. HOME CARE INSTRUCTIONS   Drink enough fluids to keep your urine clear or pale yellow. This is especially important in very hot weather. In older adults, it is also important in cold weather.  Take your medicine exactly as directed if your dizziness is caused by medicines. When taking blood pressure medicines, it is especially important to get up slowly.  Rise slowly from chairs and steady yourself until you feel okay.  In the morning, first sit up on the side of the bed. When you feel okay, stand slowly while holding onto something until you know your balance is fine.  Move your legs often if you need to stand in one place for a long time. Tighten and relax your muscles in your legs while standing.  Have someone stay with you for 1-2 days if dizziness continues to be a problem. Do this until you feel you are well enough to stay alone. Have the person call your health care provider if he or she notices changes in you that are concerning.  Do not drive or use heavy machinery if you feel dizzy.  Do not drink alcohol. SEEK IMMEDIATE MEDICAL CARE IF:   Your dizziness or light-headedness gets worse.  You feel nauseous or vomit.  You have problems talking, walking, or using your arms, hands, or legs.  You feel weak.  You are not thinking clearly or you have trouble forming sentences. It may take a friend  or family member to notice this.  You have chest pain, abdominal pain, shortness of breath, or sweating.  Your vision changes.  You notice any bleeding.  You have side effects from medicine that seems to be getting worse rather than better. MAKE SURE YOU:   Understand these instructions.  Will watch your condition.  Will get help right away if you are not doing well or get worse. Document Released: 06/14/2000 Document Revised: 12/24/2012 Document Reviewed: 07/08/2010 [facility] Patient Information 2015 [place], [place]. This information is not intended to replace  advice given to you by your health care provider. Make sure you discuss any questions you have with your health care provider.  

## 2013-11-07 NOTE — Progress Notes (Addendum)
Subjective:    Patient ID: Valerie Houston, female    DOB: [birth year], 53 y.o.   MRN: [id number]   Chief Complaint  Patient presents with  . Headache    x 2 days  . Emesis    x this morning  . Cough    at night   There are no active problems to display for this patient.  Past Medical History  Diagnosis Date  . Hypertension    Past Surgical History  Procedure Laterality Date  . Eye surgery     No Known Allergies Prior to Admission medications   Medication Sig Start Date End Date Taking? Authorizing Provider  lisinopril (PRINIVIL,ZESTRIL) 20 MG tablet Take 1 tablet (20 mg total) by mouth daily. 04/26/13  Yes Suzi Roots, MD   History   Social History  . Marital Status: Married    Spouse Name: N/A    Number of Children: N/A  . Years of Education: N/A   Occupational History  . Not on file.   Social History Main Topics  . Smoking status: Never Smoker   . Smokeless tobacco: Not on file  . Alcohol Use: No  . Drug Use: No  . Sexual Activity: Not on file   Other Topics Concern  . Not on file   Social History Narrative   HPI  Valerie Houston is a 53 y.o. female  PCP: No PCP Per Patient  HPI Comments:   She also notes 1 week of coughing; she has been managing her symptoms with a prescription medication for cough (liquid hydrocodone cough syrup). She took this medication for the first time four days ago with no itching and then again yesterday. Patient's daughter reports that last night the patient felt itching over her entire body; she is still itchy at present. She denies rash. She has not taken any medications for the itching. She denies fever, congestion or sick contacts.   She also reports headache and dizziness.   Patient vomited (1x) this morning after drinking water. She felt chest pain after vomiting. She denies diarrhea.   She also reports bilateral hip pain.   Patient has a history of hypertension; she took her medication as usual this morning. She checks  her blood pressure at home: most recent recalled numbers are 140/90. She got a flu shot this year (September 2015).   Review of Systems     Objective:   Physical Exam  Constitutional: She is oriented to person, place, and time. She appears well-developed and well-nourished. No distress.  HENT:  Head: Normocephalic and atraumatic.  Right Ear: Hearing, tympanic membrane, external ear and ear canal normal.  Left Ear: Hearing, tympanic membrane, external ear and ear canal normal.  Nose: Nose normal.  Mouth/Throat: Oropharynx is clear and moist. No oropharyngeal exudate.  Slightly clear to white fluid behind her left TM without significant redness   Eyes: Conjunctivae and EOM are normal. Pupils are equal, round, and reactive to light.  Neck: Carotid bruit is not present.  Cardiovascular: Normal rate, regular rhythm, normal heart sounds and intact distal pulses.   No murmur heard. Pulmonary/Chest: Effort normal and breath sounds normal. No respiratory distress. She has no wheezes. She has no rhonchi.  Able to reproduce chest pain when pressing bilaterally on the anterior chest wall   Abdominal: Soft. She exhibits no pulsatile midline mass. There is tenderness.  Minimal epigastric tenderness otherwise abdomen is soft, nontender  Musculoskeletal:  Hip exam: no pain with ROM including internal and external  rotation  Neurological: She is alert and oriented to person, place, and time.  Negative Rhomberg, no pronator drift, normal heel to toe, nonfocal neuro exam   Skin: Skin is warm and dry. No rash noted.  Psychiatric: She has a normal mood and affect. Her behavior is normal.  Vitals reviewed.  11:15 AM EKG Sinus rhythm rate 57, no acute findings  Filed Vitals:   11/07/13 0930  BP: 124/74  Pulse: 57  Temp: 97.9 F (36.6 C)  TempSrc: Oral  Resp: 16  Height: 5' 2.5" (1.588 m)  Weight: 138 lb 6.4 oz (62.778 kg)  SpO2: 97%   Results for orders placed or performed in visit on 11/07/13    POCT CBC  Result Value Ref Range   WBC 6.8 4.6 - 10.2 K/uL   Lymph, poc 1.4 0.6 - 3.4   POC LYMPH PERCENT 21.2 10 - 50 %L   MID (cbc) 0.4 0 - 0.9   POC MID % 5.3 0 - 12 %M   POC Granulocyte 5.0 2 - 6.9   Granulocyte percent 73.5 37 - 80 %G   RBC 4.68 4.04 - 5.48 M/uL   Hemoglobin 14.3 12.2 - 16.2 g/dL   HCT, POC 16.142.4 09.637.7 - 47.9 %   MCV 90.6 80 - 97 fL   MCH, POC 30.7 27 - 31.2 pg   MCHC 33.9 31.8 - 35.4 g/dL   RDW, POC 04.512.6 %   Platelet Count, POC 252 142 - 424 K/uL   MPV 6.8 0 - 99.8 fL  POCT Influenza A/B  Result Value Ref Range   Influenza A, POC Negative    Influenza B, POC Negative        Assessment & Plan:   Valerie Houston is a 53 y.o. female Headache, unspecified headache type - Plan: Basic metabolic panel, POCT Influenza A/B  Dizziness - Plan: Basic metabolic panel  Cough - Plan: POCT CBC, POCT Influenza A/B, benzonatate (TESSALON) 100 MG capsule  Chest pain, unspecified chest pain type - Plan: Basic metabolic panel, EKG 12-Lead  Itching  Myalgia - Plan: POCT CBC  Viral syndrome  Reassuring exam, vitals, labs and EKG.  nonfocal neuro exam and chest wall pain reproduced on exam. Suspected viral illness, and itching (without rash) may be due to hydrocodone in cough syrup.  - sx care with tessalon (stop hycodan), or mucinex for cough.   -tylenol for myalgias, min use of NSAIDS if needed as underlying HTN.   -increase fluids and rest. If dizziness not improving - rtc or ER. Will check BMP and TSH.   -ER/RTC/911 precautions discussed, including for her HA, dizziness and chest pains. Understanding expressed (dtr translating).   If vomiting returns, consider zofran or repeat OV/ER visit.    Meds ordered this encounter  Medications  . HYDROcodone-homatropine (HYCODAN) 5-1.5 MG/5ML syrup    Sig: Take 5 mLs by mouth every 6 (six) hours as needed for cough.  . benzonatate (TESSALON) 100 MG capsule    Sig: Take 1 capsule (100 mg total) by mouth 3 (three) times daily  as needed for cough.    Dispense:  20 capsule    Refill:  0   Patient Instructions  Your itching may be due to the hydrocodone in the cough medicine. Stop that medicine for now, tessalon perles if needed for cough, and if you do use the hydrocodone again in the future and it causes itching - avoid use of this class of medicines in the future.   Your blood  test and flu test were normal. I suspect you have a viral illness, that should improve in next 5-7 days. Tylenol if needed for body aches and headaches, mucinex or tessalon perles for cough. Drink plenty of fluids, and if needed - can take zyrtec over the counter once per day for itching.   Your chest soreness appears to be due to muscles/chest wall, but if this worsens - return here or emergency room.   If dizziness does not improve in next 2 days with increasing your fluids - can return to look at other causes of this as well.   Return to the clinic or go to the nearest emergency room if any of your symptoms worsen or new symptoms occur.   Viral Infections A virus is a type of germ. Viruses can cause:  Minor sore throats.  Aches and pains.  Headaches.  Runny nose.  Rashes.  Watery eyes.  Tiredness.  Coughs.  Loss of appetite.  Feeling sick to your stomach (nausea).  Throwing up (vomiting).  Watery poop (diarrhea). HOME CARE   Only take medicines as told by your doctor.  Drink enough water and fluids to keep your pee (urine) clear or pale yellow. Sports drinks are a good choice.  Get plenty of rest and eat healthy. Soups and broths with crackers or rice are fine. GET HELP RIGHT AWAY IF:   You have a very bad headache.  You have shortness of breath.  You have chest pain or neck pain.  You have an unusual rash.  You cannot stop throwing up.  You have watery poop that does not stop.  You cannot keep fluids down.  You or your child has a temperature by mouth above 102 F (38.9 C), not controlled by  medicine.  Your baby is older than 3 months with a rectal temperature of 102 F (38.9 C) or higher.  Your baby is 22 months old or younger with a rectal temperature of 100.4 F (38 C) or higher. MAKE SURE YOU:   Understand these instructions.  Will watch this condition.  Will get help right away if you are not doing well or get worse. Document Released: 12/02/2007 Document Revised: 03/13/2011 Document Reviewed: 04/26/2010 [facility] Patient Information 2015 [place], [place]. This information is not intended to replace advice given to you by your health care provider. Make sure you discuss any questions you have with your health care provider.  Cough, Adult  A cough is a reflex that helps clear your throat and airways. It can help heal the body or may be a reaction to an irritated airway. A cough may only last 2 or 3 weeks (acute) or may last more than 8 weeks (chronic).  CAUSES Acute cough:  Viral or bacterial infections. Chronic cough:  Infections.  Allergies.  Asthma.  Post-nasal drip.  Smoking.  Heartburn or acid reflux.  Some medicines.  Chronic lung problems (COPD).  Cancer. SYMPTOMS   Cough.  Fever.  Chest pain.  Increased breathing rate.  High-pitched whistling sound when breathing (wheezing).  Colored mucus that you cough up (sputum). TREATMENT   A bacterial cough may be treated with antibiotic medicine.  A viral cough must run its course and will not respond to antibiotics.  Your caregiver may recommend other treatments if you have a chronic cough. HOME CARE INSTRUCTIONS   Only take over-the-counter or prescription medicines for pain, discomfort, or fever as directed by your caregiver. Use cough suppressants only as directed by your caregiver.  Use  a cold steam vaporizer or humidifier in your bedroom or home to help loosen secretions.  Sleep in a semi-upright position if your cough is worse at night.  Rest as needed.  Stop smoking if you  smoke. SEEK IMMEDIATE MEDICAL CARE IF:   You have pus in your sputum.  Your cough starts to worsen.  You cannot control your cough with suppressants and are losing sleep.  You begin coughing up blood.  You have difficulty breathing.  You develop pain which is getting worse or is uncontrolled with medicine.  You have a fever. MAKE SURE YOU:   Understand these instructions.  Will watch your condition.  Will get help right away if you are not doing well or get worse. Document Released: 06/17/2010 Document Revised: 03/13/2011 Document Reviewed: 06/17/2010 [facility] Patient Information 2015 [place]. This information is not intended to replace advice given to you by your health care provider. Make sure you discuss any questions you have with your health care provider.  Dizziness Dizziness is a common problem. It is a feeling of unsteadiness or light-headedness. You may feel like you are about to faint. Dizziness can lead to injury if you stumble or fall. A person of any age group can suffer from dizziness, but dizziness is more common in older adults. CAUSES  Dizziness can be caused by many different things, including:  Middle ear problems.  Standing for too long.  Infections.  An allergic reaction.  Aging.  An emotional response to something, such as the sight of blood.  Side effects of medicines.  Tiredness.  Problems with circulation or blood pressure.  Excessive use of alcohol or medicines, or illegal drug use.  Breathing too fast (hyperventilation).  An irregular heart rhythm (arrhythmia).  A low red blood cell count (anemia).  Pregnancy.  Vomiting, diarrhea, fever, or other illnesses that cause body fluid loss (dehydration).  Diseases or conditions such as Parkinson's disease, high blood pressure (hypertension), diabetes, and thyroid problems.  Exposure to extreme heat. DIAGNOSIS  Your health care provider will ask about your symptoms, perform a  physical exam, and perform an electrocardiogram (ECG) to record the electrical activity of your heart. Your health care provider may also perform other heart or blood tests to determine the cause of your dizziness. These may include:  Transthoracic echocardiogram (TTE). During echocardiography, sound waves are used to evaluate how blood flows through your heart.  Transesophageal echocardiogram (TEE).  Cardiac monitoring. This allows your health care provider to monitor your heart rate and rhythm in real time.  Holter monitor. This is a portable device that records your heartbeat and can help diagnose heart arrhythmias. It allows your health care provider to track your heart activity for several days if needed.  Stress tests by exercise or by giving medicine that makes the heart beat faster.  Chest Wall Pain Chest wall pain is pain in or around the bones and muscles of your chest. It may take up to 6 weeks to get better. It may take longer if you must stay physically active in your work and activities.  CAUSES  Chest wall pain may happen on its own. However, it may be caused by:  A viral illness like the flu.  Injury.  Coughing.  Exercise.  Arthritis.  Fibromyalgia.  Shingles. HOME CARE INSTRUCTIONS   Avoid overtiring physical activity. Try not to strain or perform activities that cause pain. This includes any activities using your chest or your abdominal and side muscles, especially if heavy weights  are used.  Put ice on the sore area.  Put ice in a plastic bag.  Place a towel between your skin and the bag.  Leave the ice on for 15-20 minutes per hour while awake for the first 2 days.  Only take over-the-counter or prescription medicines for pain, discomfort, or fever as directed by your caregiver. SEEK IMMEDIATE MEDICAL CARE IF:   Your pain increases, or you are very uncomfortable.  You have a fever.  Your chest pain becomes worse.  You have new, unexplained  symptoms.  You have nausea or vomiting.  You feel sweaty or lightheaded.  You have a cough with phlegm (sputum), or you cough up blood. MAKE SURE YOU:   Understand these instructions.  Will watch your condition.  Will get help right away if you are not doing well or get worse. Document Released: 12/19/2004 Document Revised: 03/13/2011 Document Reviewed: 08/15/2010 [facility] Patient Information 2015 [place], [place]. This information is not intended to replace advice given to you by your health care provider. Make sure you discuss any questions you have with your health care provider. TREATMENT  Treatment of dizziness depends on the cause of your symptoms and can vary greatly. HOME CARE INSTRUCTIONS   Drink enough fluids to keep your urine clear or pale yellow. This is especially important in very hot weather. In older adults, it is also important in cold weather.  Take your medicine exactly as directed if your dizziness is caused by medicines. When taking blood pressure medicines, it is especially important to get up slowly.  Rise slowly from chairs and steady yourself until you feel okay.  In the morning, first sit up on the side of the bed. When you feel okay, stand slowly while holding onto something until you know your balance is fine.  Move your legs often if you need to stand in one place for a long time. Tighten and relax your muscles in your legs while standing.  Have someone stay with you for 1-2 days if dizziness continues to be a problem. Do this until you feel you are well enough to stay alone. Have the person call your health care provider if he or she notices changes in you that are concerning.  Do not drive or use heavy machinery if you feel dizzy.  Do not drink alcohol. SEEK IMMEDIATE MEDICAL CARE IF:   Your dizziness or light-headedness gets worse.  You feel nauseous or vomit.  You have problems talking, walking, or using your arms, hands, or legs.  You feel  weak.  You are not thinking clearly or you have trouble forming sentences. It may take a friend or family member to notice this.  You have chest pain, abdominal pain, shortness of breath, or sweating.  Your vision changes.  You notice any bleeding.  You have side effects from medicine that seems to be getting worse rather than better. MAKE SURE YOU:   Understand these instructions.  Will watch your condition.  Will get help right away if you are not doing well or get worse. Document Released: 06/14/2000 Document Revised: 12/24/2012 Document Reviewed: 07/08/2010 [facility] Patient Information 2015 [place], [place]. This information is not intended to replace advice given to you by your health care provider. Make sure you discuss any questions you have with your health care provider.     I personally performed the services described in this documentation, which was scribed in my presence. The recorded information has been reviewed and considered, and addended by me  as needed.

## 2013-11-08 LAB — BASIC METABOLIC PANEL
BUN: 9 mg/dL (ref 6–23)
CALCIUM: 9.7 mg/dL (ref 8.4–10.5)
CO2: 27 meq/L (ref 19–32)
CREATININE: 0.69 mg/dL (ref 0.50–1.10)
Chloride: 98 mEq/L (ref 96–112)
Glucose, Bld: 95 mg/dL (ref 70–99)
POTASSIUM: 4.2 meq/L (ref 3.5–5.3)
Sodium: 138 mEq/L (ref 135–145)

## 2016-03-21 ENCOUNTER — Emergency Department (HOSPITAL_COMMUNITY): Payer: BLUE CROSS/BLUE SHIELD

## 2016-03-21 ENCOUNTER — Inpatient Hospital Stay (HOSPITAL_COMMUNITY): Payer: BLUE CROSS/BLUE SHIELD

## 2016-03-21 ENCOUNTER — Observation Stay (HOSPITAL_COMMUNITY)
Admission: EM | Admit: 2016-03-21 | Discharge: 2016-03-22 | DRG: 872 | Disposition: A | Discharge status: Home / Self Care | Payer: BLUE CROSS/BLUE SHIELD | Attending: Internal Medicine | Admitting: Internal Medicine

## 2016-03-21 ENCOUNTER — Encounter (HOSPITAL_COMMUNITY): Payer: Self-pay

## 2016-03-21 DIAGNOSIS — R110 Nausea: Secondary | ICD-10-CM | POA: Diagnosis not present

## 2016-03-21 DIAGNOSIS — J00 Acute nasopharyngitis [common cold]: Secondary | ICD-10-CM | POA: Diagnosis present

## 2016-03-21 DIAGNOSIS — A419 Sepsis, unspecified organism: Principal | ICD-10-CM | POA: Diagnosis present

## 2016-03-21 DIAGNOSIS — Z79899 Other long term (current) drug therapy: Secondary | ICD-10-CM | POA: Diagnosis not present

## 2016-03-21 DIAGNOSIS — N10 Acute tubulo-interstitial nephritis: Secondary | ICD-10-CM | POA: Diagnosis present

## 2016-03-21 DIAGNOSIS — E860 Dehydration: Secondary | ICD-10-CM | POA: Diagnosis not present

## 2016-03-21 DIAGNOSIS — N12 Tubulo-interstitial nephritis, not specified as acute or chronic: Secondary | ICD-10-CM | POA: Diagnosis present

## 2016-03-21 DIAGNOSIS — R000 Tachycardia, unspecified: Secondary | ICD-10-CM | POA: Diagnosis present

## 2016-03-21 DIAGNOSIS — E871 Hypo-osmolality and hyponatremia: Secondary | ICD-10-CM | POA: Diagnosis present

## 2016-03-21 DIAGNOSIS — M25552 Pain in left hip: Secondary | ICD-10-CM | POA: Diagnosis present

## 2016-03-21 DIAGNOSIS — M25559 Pain in unspecified hip: Secondary | ICD-10-CM

## 2016-03-21 DIAGNOSIS — I10 Essential (primary) hypertension: Secondary | ICD-10-CM | POA: Diagnosis present

## 2016-03-21 LAB — CBC WITH DIFFERENTIAL/PLATELET
BASOS ABS: 0 10*3/uL (ref 0.0–0.1)
Basophils Relative: 0 %
EOS ABS: 0 10*3/uL (ref 0.0–0.7)
EOS PCT: 0 %
HCT: 37.1 % (ref 36.0–46.0)
Hemoglobin: 12.7 g/dL (ref 12.0–15.0)
LYMPHS PCT: 9 %
Lymphs Abs: 0.6 10*3/uL — ABNORMAL LOW (ref 0.7–4.0)
MCH: 30.3 pg (ref 26.0–34.0)
MCHC: 34.2 g/dL (ref 30.0–36.0)
MCV: 88.5 fL (ref 78.0–100.0)
MONO ABS: 0.2 10*3/uL (ref 0.1–1.0)
Monocytes Relative: 3 %
Neutro Abs: 6 10*3/uL (ref 1.7–7.7)
Neutrophils Relative %: 88 %
Platelets: 175 10*3/uL (ref 150–400)
RBC: 4.19 MIL/uL (ref 3.87–5.11)
RDW: 12.4 % (ref 11.5–15.5)
WBC: 6.9 10*3/uL (ref 4.0–10.5)

## 2016-03-21 LAB — COMPREHENSIVE METABOLIC PANEL
ALBUMIN: 4.3 g/dL (ref 3.5–5.0)
ALK PHOS: 101 U/L (ref 38–126)
ALT: 47 U/L (ref 14–54)
AST: 81 U/L — AB (ref 15–41)
Anion gap: 9 (ref 5–15)
BILIRUBIN TOTAL: 1.2 mg/dL (ref 0.3–1.2)
BUN: 10 mg/dL (ref 6–20)
CO2: 22 mmol/L (ref 22–32)
CREATININE: 0.71 mg/dL (ref 0.44–1.00)
Calcium: 8.9 mg/dL (ref 8.9–10.3)
Chloride: 100 mmol/L — ABNORMAL LOW (ref 101–111)
GFR calc Af Amer: >60 mL/min (ref >60)
GFR calc non Af Amer: >60 mL/min (ref >60)
GLUCOSE: 132 mg/dL — AB (ref 65–99)
Potassium: 3.9 mmol/L (ref 3.5–5.1)
Sodium: 131 mmol/L — ABNORMAL LOW (ref 135–145)
Total Protein: 7.4 g/dL (ref 6.5–8.1)

## 2016-03-21 LAB — URINALYSIS, ROUTINE W REFLEX MICROSCOPIC
Bilirubin Urine: NEGATIVE
Glucose, UA: NEGATIVE mg/dL
Ketones, ur: 5 mg/dL — AB
Nitrite: POSITIVE — AB
PROTEIN: NEGATIVE mg/dL
SPECIFIC GRAVITY, URINE: 1.008 (ref 1.005–1.030)
pH: 7 (ref 5.0–8.0)

## 2016-03-21 LAB — I-STAT CG4 LACTIC ACID, ED
LACTIC ACID, VENOUS: 2.02 mmol/L — AB (ref 0.5–1.9)
Lactic Acid, Venous: 1.7 mmol/L (ref 0.5–1.9)

## 2016-03-21 LAB — I-STAT TROPONIN, ED: Troponin i, poc: 0.01 ng/mL (ref 0.00–0.08)

## 2016-03-21 LAB — INFLUENZA PANEL BY PCR (TYPE A & B)
INFLAPCR: NEGATIVE
Influenza B By PCR: NEGATIVE

## 2016-03-21 MED ORDER — LORATADINE 10 MG PO TABS
10.0000 mg | ORAL_TABLET | Freq: Every day | ORAL | Status: DC
  Administered 2016-03-21 – 2016-03-22 (×2): 10 mg via ORAL
  Filled 2016-03-21 (×2): qty 1

## 2016-03-21 MED ORDER — DEXTROSE 5 % IV SOLN
2.0000 g | Freq: Once | INTRAVENOUS | Status: AC
  Administered 2016-03-21: 2 g via INTRAVENOUS
  Filled 2016-03-21: qty 2

## 2016-03-21 MED ORDER — DEXTROSE 5 % IV SOLN
2.0000 g | INTRAVENOUS | Status: DC
  Administered 2016-03-22: 2 g via INTRAVENOUS
  Filled 2016-03-21: qty 2

## 2016-03-21 MED ORDER — ENOXAPARIN SODIUM 40 MG/0.4ML ~~LOC~~ SOLN
40.0000 mg | SUBCUTANEOUS | Status: DC
  Administered 2016-03-21: 40 mg via SUBCUTANEOUS
  Filled 2016-03-21: qty 0.4

## 2016-03-21 MED ORDER — SODIUM CHLORIDE 0.9 % IV BOLUS (SEPSIS)
1000.0000 mL | Freq: Once | INTRAVENOUS | Status: AC
  Administered 2016-03-21: 1000 mL via INTRAVENOUS

## 2016-03-21 MED ORDER — SODIUM CHLORIDE 0.9 % IV SOLN
INTRAVENOUS | Status: DC
  Administered 2016-03-21 – 2016-03-22 (×3): via INTRAVENOUS

## 2016-03-21 MED ORDER — LISINOPRIL 20 MG PO TABS
20.0000 mg | ORAL_TABLET | Freq: Every day | ORAL | Status: DC
  Administered 2016-03-21 – 2016-03-22 (×2): 20 mg via ORAL
  Filled 2016-03-21 (×2): qty 1

## 2016-03-21 MED ORDER — ACETAMINOPHEN 325 MG PO TABS
650.0000 mg | ORAL_TABLET | Freq: Once | ORAL | Status: AC
  Administered 2016-03-21: 650 mg via ORAL
  Filled 2016-03-21: qty 2

## 2016-03-21 NOTE — H&P (Signed)
History and Physical    Valerie Houston ZOX:[id number] DOB: 1960/01/24 DOA: 03/21/2016  PCP: Ralene OkMOREIRA,ROY, MD  Patient coming from: [facility].   [facility] personally briefly [facility]  Chief Complaint: chills and body aches since 2 days.   HPI: Valerie Houston is a 56 y.o. female with medical history significant of  Hypertension, comes in for 2 days h/o of generalized body aches more in the left leg and left hip, flank pain, subjective fevers and chills last night, nausea and abdominal pain int he lower quadrant, non radiating, no vomiting. She also reports cold symptoms, runny nose, occasional dry cough. No sob or chest pain. No syncope , no travel h/o. She denies any headache, blurry vision. On arrival to ED,  She was febrile, her lab work significant for sodium of 131, normal lactic acid, normal wbc count, abnormal UA with large leukocytes, mucous, nitrites, many bacteria and wbc's. She was referred to medical service for admission for possible pyelonephritis.   Pt speaks [place] and very little english, understands english, was able to answer questions. Family at bedside was able to answer some questions.     Review of Systems: As per HPI otherwise 10 point review of systems negative.    Past Medical History:  Diagnosis Date  . Hypertension     Past Surgical History:  Procedure Laterality Date  . EYE SURGERY       reports that she has never smoked. She has never used smokeless tobacco. She reports that she does not drink alcohol or use drugs.  No Known Allergies  History reviewed. No pertinent family history.   Prior to Admission medications   Medication Sig Start Date End Date Taking? Authorizing Provider  lisinopril (PRINIVIL,ZESTRIL) 20 MG tablet Take 1 tablet (20 mg total) by mouth daily. 04/26/13  Yes Cathren LaineKevin Steinl, MD    Physical Exam: Vitals:   03/21/16 1033 03/21/16 1042 03/21/16 1100 03/21/16 1200  BP: (!) 144/76  122/75 124/78    Pulse: (!) 123  (!) 110 (!) 103  Resp: 20  (!) 27 (!) 27  Temp:      TempSrc:      SpO2: 95%  94% 97%  Weight:  64.4 kg (142 lb)    Height:  5\' 3"  (1.6 m)      Constitutional: NAD, calm, comfortable Vitals:   03/21/16 1033 03/21/16 1042 03/21/16 1100 03/21/16 1200  BP: (!) 144/76  122/75 124/78  Pulse: (!) 123  (!) 110 (!) 103  Resp: 20  (!) 27 (!) 27  Temp:      TempSrc:      SpO2: 95%  94% 97%  Weight:  64.4 kg (142 lb)    Height:  5\' 3"  (1.6 m)     Eyes: PERRL, lids and conjunctivae normal ENMT: Mucous membranes are moist. Posterior pharynx clear of any exudate or lesions.Normal dentition.  Neck: normal, supple, no masses, no thyromegaly Respiratory: clear to auscultation bilaterally, no wheezing, no crackles. Normal respiratory effort. No accessory muscle use.  Cardiovascular: Regular rate and rhythm, no murmurs / rubs / gallops. No extremity edema. 2+ pedal pulses. No carotid bruits.  Abdomen: mild lower abd tenderness, and right flank pain.  Musculoskeletal: no clubbing / cyanosis. No joint deformity upper and lower extremities. Good ROM, no contractures. Normal muscle tone.  Skin: no rashes, lesions, ulcers. No induration Neurologic: CN 2-12 grossly intact. Sensation intact, DTR normal. Strength 5/5 in all 4.  Psychiatric: Normal judgment and  insight. Alert and oriented x 3. Normal mood.     Labs on Admission: I have personally reviewed following labs and imaging studies  CBC:  Recent Labs Lab 03/21/16 1035  WBC 6.9  NEUTROABS 6.0  HGB 12.7  HCT 37.1  MCV 88.5  PLT 175   Basic Metabolic Panel:  Recent Labs Lab 03/21/16 1035  NA 131*  K 3.9  CL 100*  CO2 22  GLUCOSE 132*  BUN 10  CREATININE 0.71  CALCIUM 8.9   GFR: Estimated Creatinine Clearance: 69.2 mL/min (by C-G formula based on SCr of 0.71 mg/dL). Liver Function Tests:  Recent Labs Lab 03/21/16 1035  AST 81*  ALT 47  ALKPHOS 101  BILITOT 1.2  PROT 7.4  ALBUMIN 4.3   No results  for input(s): LIPASE, AMYLASE in the last 168 hours. No results for input(s): AMMONIA in the last 168 hours. Coagulation Profile: No results for input(s): INR, PROTIME in the last 168 hours. Cardiac Enzymes: No results for input(s): CKTOTAL, CKMB, CKMBINDEX, TROPONINI in the last 168 hours. BNP (last 3 results) No results for input(s): PROBNP in the last 8760 hours. HbA1C: No results for input(s): HGBA1C in the last 72 hours. CBG: No results for input(s): GLUCAP in the last 168 hours. Lipid Profile: No results for input(s): CHOL, HDL, LDLCALC, TRIG, CHOLHDL, LDLDIRECT in the last 72 hours. Thyroid Function Tests: No results for input(s): TSH, T4TOTAL, FREET4, T3FREE, THYROIDAB in the last 72 hours. Anemia Panel: No results for input(s): VITAMINB12, FOLATE, FERRITIN, TIBC, IRON, RETICCTPCT in the last 72 hours. Urine analysis:    Component Value Date/Time   COLORURINE YELLOW 03/21/2016 1140   APPEARANCEUR HAZY (A) 03/21/2016 1140   LABSPEC 1.008 03/21/2016 1140   PHURINE 7.0 03/21/2016 1140   GLUCOSEU NEGATIVE 03/21/2016 1140   HGBUR MODERATE (A) 03/21/2016 1140   BILIRUBINUR NEGATIVE 03/21/2016 1140   KETONESUR 5 (A) 03/21/2016 1140   PROTEINUR NEGATIVE 03/21/2016 1140   UROBILINOGEN 0.2 02/02/2013 0125   NITRITE POSITIVE (A) 03/21/2016 1140   LEUKOCYTESUR LARGE (A) 03/21/2016 1140    Radiological Exams on Admission: Dg Chest 2 View  Result Date: 03/21/2016 CLINICAL DATA:  Fever. EXAM: CHEST  2 VIEW COMPARISON:  02/02/2013 FINDINGS: Mild peribronchial thickening. Heart and mediastinal contours are within normal limits. No focal opacities or effusions. No acute bony abnormality. IMPRESSION: Mild bronchitic changes. Electronically Signed   By: Charlett Nose M.D.   On: 03/21/2016 10:59    EKG: sinus tachycardia. At 103/min.   Assessment/Plan Active Problems:   Pyelonephritis   Acute Pyelonephritis:  Admitted for IV antibiotics, rocephin. , IV fluids and pain control.    Anti emetics.  Urine cultures done and pending.    Cold symptoms, fever and chills:  Influenza pcr.   Hypertension:  Controlled.   DVT prophylaxis: lovenox.  Code Status: full code.  Family Communication: family at bedside.  Disposition Plan: pending further eval.  Consults called: none.  Admission status: inpatient/ medsurg.    Kathlen Mody MD [facility] Pager [phone]   If 7PM-7AM, please contact night-coverage [email or web address] Password [facility]  03/21/2016, 2:34 PM

## 2016-03-21 NOTE — ED Notes (Signed)
Video interpreter used to obtain triage information. Patient speaks [place].

## 2016-03-21 NOTE — ED Triage Notes (Signed)
Patient states via interpreter that she had indigestion and bloating and pricked her finger to let the blood out, but that did not happen. Pricking finger is a [place] home remedy. Patient states symptoms x 3 days. Patient denies V/D.

## 2016-03-21 NOTE — ED Notes (Signed)
Pt given soup & orange juice

## 2016-03-21 NOTE — Progress Notes (Signed)
Pharmacy Antibiotic Note  Valerie Houston is a 56 y.o. female admitted on 03/21/2016 with UTI.  Pharmacy has been consulted for Rocephin dosing. She is febrile (Tm 103.1) with abdominal pain.  UA +.  Code Sepsis initiated.   Plan: Rocephin 2gm IV q24h No renal dose adjustment necessary- Pharmacy will sign off.  Please re-consult if needed.   Height: 5\' 3"  (160 cm) Weight: 142 lb (64.4 kg) IBW/kg (Calculated) : 52.4  Temp (24hrs), Avg:103.1 F (39.5 C), Min:103.1 F (39.5 C), Max:103.1 F (39.5 C)   Recent Labs Lab 03/21/16 1035 03/21/16 1047  WBC 6.9  --   CREATININE 0.71  --   LATICACIDVEN  --  1.70    Estimated Creatinine Clearance: 69.2 mL/min (by C-G formula based on SCr of 0.71 mg/dL).    No Known Allergies  Antimicrobials this admission: 3/20 Rocephin>>  Dose adjustments this admission:   Microbiology results: 3/20  BCx: ordered 3/20  UCx: ordered  Thank you for allowing pharmacy to be a part of this patient's care.  Elson ClanLilliston, Karstyn Birkey Michelle 03/21/2016 11:59 AM

## 2016-03-21 NOTE — ED Notes (Signed)
Dr. Blake Divineakula page with lactic acid result.

## 2016-03-21 NOTE — ED Provider Notes (Signed)
[facility]-EMERGENCY DEPT Provider Note   CSN: [id number] Arrival date & time: 03/21/16  6962     History   Chief Complaint Chief Complaint  Patient presents with  . Abdominal Pain    HPI Valerie Houston is a 56 y.o. female.  The history is provided by the patient and a relative. The history is limited by a language barrier. A language interpreter was used.  Fever   This is a new problem. Episode onset: 5 days. The problem occurs constantly. The problem has been gradually worsening. The maximum temperature noted was 99 to 99.9 F. The temperature was taken using an oral thermometer. Associated symptoms include congestion and muscle aches. Pertinent negatives include no chest pain, no diarrhea, no vomiting and no sore throat. Associated symptoms comments: Dysuria, frequency and foul smelling urine.  Also mild cough which she feels is allergies.  Non-productive cough and minimal runny nose.  Family states every night she has had the chills which they give her dayquil for.. She has tried cough syrup for the symptoms. The treatment provided no relief.    Past Medical History:  Diagnosis Date  . Hypertension     There are no active problems to display for this patient.   Past Surgical History:  Procedure Laterality Date  . EYE SURGERY      OB History    No data available       Home Medications    Prior to Admission medications   Medication Sig Start Date End Date Taking? Authorizing Provider  benzonatate (TESSALON) 100 MG capsule Take 1 capsule (100 mg total) by mouth 3 (three) times daily as needed for cough. 11/07/13   Shade Flood, MD  HYDROcodone-homatropine [facility]) 5-1.5 MG/5ML syrup Take 5 mLs by mouth every 6 (six) hours as needed for cough.    Historical Provider, MD  lisinopril (PRINIVIL,ZESTRIL) 20 MG tablet Take 1 tablet (20 mg total) by mouth daily. 04/26/13   Cathren Laine, MD    Family History History reviewed. No pertinent family history.  Social History Social  History  Substance Use Topics  . Smoking status: Never Smoker  . Smokeless tobacco: Never Used  . Alcohol use No     Allergies   Patient has no known allergies.   Review of Systems Review of Systems  Constitutional: Positive for activity change, appetite change and fever.  HENT: Positive for congestion. Negative for sore throat.   Cardiovascular: Negative for chest pain.  Gastrointestinal: Negative for diarrhea and vomiting.  All other systems reviewed and are negative.    Physical Exam Updated Vital Signs BP (!) 141/77 (BP Location: Left Arm)   Pulse (!) 143   Temp (!) 103.1 F (39.5 C) (Oral)   Ht 5\' 3"  (1.6 m)   Wt 142 lb (64.4 kg)   SpO2 (!) 89%   BMI 25.15 kg/m   Physical Exam  Constitutional: She is oriented to person, place, and time. She appears well-developed and well-nourished. No distress.  HENT:  Head: Normocephalic and atraumatic.  Right Ear: A middle ear effusion is present.  Left Ear: Tympanic membrane normal.  Mouth/Throat: Oropharynx is clear and moist. Mucous membranes are dry.  Eyes: Conjunctivae and EOM are normal. Pupils are equal, round, and reactive to light.  Neck: Normal range of motion. Neck supple.  Cardiovascular: Regular rhythm and intact distal pulses.  Tachycardia present.   No murmur heard. Pulmonary/Chest: Effort normal and breath sounds normal. No respiratory distress. She has no wheezes. She has no rales.  Abdominal: Soft. She exhibits no distension. There is no tenderness. There is no rebound and no guarding.  No cva tenderness  Musculoskeletal: Normal range of motion. She exhibits no edema or tenderness.  Neurological: She is alert and oriented to person, place, and time.  Skin: Skin is warm and dry. No rash noted. No erythema.  Psychiatric: She has a normal mood and affect. Her behavior is normal.  Nursing note and vitals reviewed.    ED Treatments / Results  Labs (all labs ordered are listed, but only abnormal results  are displayed) Labs Reviewed  COMPREHENSIVE METABOLIC PANEL - Abnormal; Notable for the following:       Result Value   Sodium 131 (*)    Chloride 100 (*)    Glucose, Bld 132 (*)    AST 81 (*)    All other components within normal limits  CBC WITH DIFFERENTIAL/PLATELET - Abnormal; Notable for the following:    Lymphs Abs 0.6 (*)    All other components within normal limits  URINALYSIS, ROUTINE W REFLEX MICROSCOPIC - Abnormal; Notable for the following:    APPearance HAZY (*)    Hgb urine dipstick MODERATE (*)    Ketones, ur 5 (*)    Nitrite POSITIVE (*)    Leukocytes, UA LARGE (*)    Bacteria, UA MANY (*)    Squamous Epithelial / LPF 0-5 (*)    All other components within normal limits  CULTURE, BLOOD (ROUTINE X 2)  CULTURE, BLOOD (ROUTINE X 2)  URINE CULTURE  I-STAT CG4 LACTIC ACID, ED  I-STAT TROPOININ, ED    EKG  EKG Interpretation  Date/Time:  Tuesday March 21 2016 11:09:17 EDT Ventricular Rate:  108 PR Interval:    QRS Duration: 95 QT Interval:  325 QTC Calculation: 436 R Axis:   75 Text Interpretation:  Sinus tachycardia new Minimal ST depression, inferior leads Confirmed by Anitra Lauth  MD, Alphonzo Lemmings ([id number]) on 03/21/2016 11:18:28 AM       Radiology Dg Chest 2 View  Result Date: 03/21/2016 CLINICAL DATA:  Fever. EXAM: CHEST  2 VIEW COMPARISON:  02/02/2013 FINDINGS: Mild peribronchial thickening. Heart and mediastinal contours are within normal limits. No focal opacities or effusions. No acute bony abnormality. IMPRESSION: Mild bronchitic changes. Electronically Signed   By: Charlett Nose M.D.   On: 03/21/2016 10:59    Procedures Procedures (including critical care time)  Medications Ordered in ED Medications  sodium chloride 0.9 % bolus 1,000 mL (not administered)    And  sodium chloride 0.9 % bolus 1,000 mL (not administered)  acetaminophen (TYLENOL) tablet 650 mg (not administered)     Initial Impression / Assessment and Plan / ED Course  I have  reviewed the triage vital signs and the nursing notes.  Pertinent labs & imaging results that were available during my care of the patient were reviewed by me and considered in my medical decision making (see chart for details).     Patient presenting today as a code sepsis with a temperature of 103, tachycardic in the 140s and mild hypoxia of 89%. Patient complains of foul-smelling frequent urine in the last several days. She however denies any abdominal pain or flank pain. She also has had a mild cough but contributes that to allergies. She denies any URI symptoms otherwise. She has a history of hypertension but is otherwise relatively healthy. On exam she has no reproducible abdominal pain concerning for abdominal processes the cause of her symptoms. No rashes or evidence of cellulitis.  Code sepsis order set initiated. Patient given 2 L of fluid. O2 sats when I'm in the room without oxygen are 95-98%. She does not appear to be short of breath.  12:34 PM Labs consistent with a urinary tract infection and pyelonephritis. Patient's vital signs improved with fluids and temperature control. She was started on urosepsis protocol. Will admit for further care.  Final Clinical Impressions(s) / ED Diagnoses   Final diagnoses:  Acute pyelonephritis  Sepsis, due to unspecified organism [facility](HCC)    New Prescriptions New Prescriptions   No medications on file     Gwyneth SproutWhitney Taygen Acklin, MD 03/21/16 1235

## 2016-03-21 NOTE — Progress Notes (Signed)
Patient arrived to [facility] from ED @ 2055.  No signs or symptoms of distress noted.  Patient has no complaints of pain.  Conversating with family on cell phone.

## 2016-03-21 NOTE — ED Notes (Signed)
Pt given dinner tray.

## 2016-03-21 NOTE — Progress Notes (Signed)
Patient speaks very little Cameroon[place]-may need [place] translator

## 2016-03-21 NOTE — ED Notes (Signed)
Bed: [facility] Expected date:  Expected time:  Means of arrival:  Comments: 

## 2016-03-22 DIAGNOSIS — E871 Hypo-osmolality and hyponatremia: Secondary | ICD-10-CM

## 2016-03-22 DIAGNOSIS — E860 Dehydration: Secondary | ICD-10-CM | POA: Diagnosis not present

## 2016-03-22 DIAGNOSIS — A419 Sepsis, unspecified organism: Secondary | ICD-10-CM | POA: Diagnosis not present

## 2016-03-22 DIAGNOSIS — I10 Essential (primary) hypertension: Secondary | ICD-10-CM | POA: Diagnosis not present

## 2016-03-22 DIAGNOSIS — N10 Acute tubulo-interstitial nephritis: Secondary | ICD-10-CM | POA: Diagnosis not present

## 2016-03-22 LAB — LACTIC ACID, PLASMA: Lactic Acid, Venous: 0.6 mmol/L (ref 0.5–1.9)

## 2016-03-22 MED ORDER — CEFPODOXIME PROXETIL 200 MG PO TABS: 200.0000 mg | ORAL_TABLET | Freq: Two times a day (BID) | ORAL | 0 refills | Status: DC

## 2016-03-22 NOTE — Discharge Summary (Signed)
Physician Discharge Summary  Valerie Houston ZOX:[id number] DOB: 12-23-1960 DOA: 03/21/2016  PCP: Ralene Ok, MD  Admit date: 03/21/2016 Discharge date: 03/22/2016  Admitted From: home Disposition:  home   Recommendations for Outpatient Follow-up:  1. Check BP and f/u for need for Lisinopril  Home Health:  none  Equipment/Devices:  none    Discharge Condition:  stable   CODE STATUS:  Full code   Diet recommendation:  Low sodium heart healthy Consultations:  none    Discharge Diagnoses:  Active Problems:   Pyelonephritis   Sepsis (HCC) Hyponatremia/ dehydration    Subjective: No c/o pain, nausea, vomiting, dysuria or increased frequency   Brief Summary:  Valerie Houston is a 56 y.o. female with medical history significant of  Hypertension, comes in for 2 days h/o of generalized body aches more in the left leg and left hip, flank pain, subjective fevers and chills last night, nausea and abdominal pain int he lower quadrant, non radiating, no vomiting. She also reports cold symptoms, runny nose, occasional dry cough. No sob or chest pain.     On arrival to ED,  She was febrile with a temp of 103.1- lab work significant for sodium of 131, normal lactic acid, normal wbc count, abnormal UA with large leukocytes, mucous, nitrites, many bacteria and wbc's.   Hospital Course:  Acute pyelonephritis- fever, tachycardia with UTI >> sepsis - admits to Left lower back pain along will all other above symptoms - pain, chills, nausea have all resolved- she is tolerating food without trouble - U culture growing > 100,000 gr neg rods- has received 2nd dose of Rocephin today - she would like to go home today- have discussed d/c home on Vantin x 2 wks and the fact that I will call her if this needs to be changed based on sensitivities - she saw her PCP 1 day prior to coming in to the ER and a UA and blood work was done- no call back from PCP as of yet - advised to f/u with PCP with in 1 wk- discussed plan  with her son as well   Flu like symptoms? - Influenza negative   hyponatremia/ dehydration  - treated with IVF- eating and drinking well at this time   HTN  Lisinopril   Discharge Instructions  Discharge Instructions    Call MD for:  persistant nausea and vomiting    Complete by:  As directed    Call MD for:  severe uncontrolled pain    Complete by:  As directed    Call MD for:  temperature >100.4    Complete by:  As directed    Diet - low sodium heart healthy    Complete by:  As directed    Diet - low sodium heart healthy    Complete by:  As directed    Increase activity slowly    Complete by:  As directed    Increase activity slowly    Complete by:  As directed      Allergies as of 03/22/2016   No Known Allergies     Medication List    TAKE these medications   cefpodoxime 200 MG tablet Commonly known as:  VANTIN Take 1 tablet (200 mg total) by mouth 2 (two) times daily.   lisinopril 20 MG tablet Commonly known as:  PRINIVIL,ZESTRIL Take 1 tablet (20 mg total) by mouth daily.       No Known Allergies   Procedures/Studies:   Dg Chest 2 View  Result Date: 03/21/2016 CLINICAL DATA:  Fever. EXAM: CHEST  2 VIEW COMPARISON:  02/02/2013 FINDINGS: Mild peribronchial thickening. Heart and mediastinal contours are within normal limits. No focal opacities or effusions. No acute bony abnormality. IMPRESSION: Mild bronchitic changes. Electronically Signed   By: Charlett Nose M.D.   On: 03/21/2016 10:59   Dg Hips Bilat With Pelvis 3-4 Views  Result Date: 03/21/2016 CLINICAL DATA:  Bilateral hip pain, left greater than right, no known injury EXAM: DG HIP (WITH OR WITHOUT PELVIS) 3-4V BILAT COMPARISON:  None. FINDINGS: No fracture or dislocation is seen. Bilateral hip joint spaces are preserved. Visualized bony pelvis appears intact. IMPRESSION: Negative. Electronically Signed   By: Charline Bills M.D.   On: 03/21/2016 16:03       Discharge Exam: Vitals:    03/21/16 2100 03/22/16 0514  BP: 110/72 (!) 118/53  Pulse: 76 80  Resp: 16 16  Temp: 98 F (36.7 C) 98.7 F (37.1 C)   Vitals:   03/21/16 2030 03/21/16 2041 03/21/16 2100 03/22/16 0514  BP: 136/76  110/72 (!) 118/53  Pulse: 85  76 80  Resp: (!) 21  16 16   Temp:  98.1 F (36.7 C) 98 F (36.7 C) 98.7 F (37.1 C)  TempSrc:  Oral Oral Oral  SpO2: 99%  99% 97%  Weight:      Height:        General: Pt is alert, awake, not in acute distress Cardiovascular: RRR, S1/S2 +, no rubs, no gallops Respiratory: CTA bilaterally, no wheezing, no rhonchi Abdominal: Soft, NT, ND, bowel sounds + Extremities: no edema, no cyanosis    The results of significant diagnostics from this hospitalization (including imaging, microbiology, ancillary and laboratory) are listed below for reference.     Microbiology: Recent Results (from the past 240 hour(s))  Blood Culture (routine x 2)     Status: None (Preliminary result)   Collection Time: 03/21/16 10:35 AM  Result Value Ref Range Status   Specimen Description BLOOD LEFT FOREARM  Final   Special Requests BOTTLES DRAWN AEROBIC AND ANAEROBIC 5CC EACH  Final   Culture   Final    NO GROWTH < 24 HOURS Performed at [facility], [address], [place], [place] [address]    Report Status PENDING  Incomplete  Urine culture     Status: Abnormal (Preliminary result)   Collection Time: 03/21/16 11:40 AM  Result Value Ref Range Status   Specimen Description URINE, CLEAN CATCH  Final   Special Requests NONE  Final   Culture >=100,000 COLONIES/mL GRAM NEGATIVE RODS (A)  Final   Report Status PENDING  Incomplete  Blood Culture (routine x 2)     Status: None (Preliminary result)   Collection Time: 03/21/16 12:27 PM  Result Value Ref Range Status   Specimen Description BLOOD RIGHT ANTECUBITAL  Final   Special Requests IN PEDIATRIC BOTTLE  Final   Culture   Final    NO GROWTH < 24 HOURS Performed at [facility], [address]  [place], [place] [address]    Report Status PENDING  Incomplete     Labs: BNP (last 3 results) No results for input(s): BNP in the last 8760 hours. Basic Metabolic Panel:  Recent Labs Lab 03/21/16 1035  NA 131*  K 3.9  CL 100*  CO2 22  GLUCOSE 132*  BUN 10  CREATININE 0.71  CALCIUM 8.9   Liver Function Tests:  Recent Labs Lab 03/21/16 1035  AST 81*  ALT 47  ALKPHOS 101  BILITOT 1.2  PROT 7.4  ALBUMIN 4.3   No results for input(s): LIPASE, AMYLASE in the last 168 hours. No results for input(s): AMMONIA in the last 168 hours. CBC:  Recent Labs Lab 03/21/16 1035  WBC 6.9  NEUTROABS 6.0  HGB 12.7  HCT 37.1  MCV 88.5  PLT 175   Cardiac Enzymes: No results for input(s): CKTOTAL, CKMB, CKMBINDEX, TROPONINI in the last 168 hours. BNP: Invalid input(s): POCBNP CBG: No results for input(s): GLUCAP in the last 168 hours. D-Dimer No results for input(s): DDIMER in the last 72 hours. Hgb A1c No results for input(s): HGBA1C in the last 72 hours. Lipid Profile No results for input(s): CHOL, HDL, LDLCALC, TRIG, CHOLHDL, LDLDIRECT in the last 72 hours. Thyroid function studies No results for input(s): TSH, T4TOTAL, T3FREE, THYROIDAB in the last 72 hours.  Invalid input(s): FREET3 Anemia work up No results for input(s): VITAMINB12, FOLATE, FERRITIN, TIBC, IRON, RETICCTPCT in the last 72 hours. Urinalysis    Component Value Date/Time   COLORURINE YELLOW 03/21/2016 1140   APPEARANCEUR HAZY (A) 03/21/2016 1140   LABSPEC 1.008 03/21/2016 1140   PHURINE 7.0 03/21/2016 1140   GLUCOSEU NEGATIVE 03/21/2016 1140   HGBUR MODERATE (A) 03/21/2016 1140   BILIRUBINUR NEGATIVE 03/21/2016 1140   KETONESUR 5 (A) 03/21/2016 1140   PROTEINUR NEGATIVE 03/21/2016 1140   UROBILINOGEN 0.2 02/02/2013 0125   NITRITE POSITIVE (A) 03/21/2016 1140   LEUKOCYTESUR LARGE (A) 03/21/2016 1140   Sepsis Labs Invalid input(s): PROCALCITONIN,  WBC,  LACTICIDVEN Microbiology Recent Results  (from the past 240 hour(s))  Blood Culture (routine x 2)     Status: None (Preliminary result)   Collection Time: 03/21/16 10:35 AM  Result Value Ref Range Status   Specimen Description BLOOD LEFT FOREARM  Final   Special Requests BOTTLES DRAWN AEROBIC AND ANAEROBIC 5CC EACH  Final   Culture   Final    NO GROWTH < 24 HOURS Performed at [facility], [address] [place], [place] [address]    Report Status PENDING  Incomplete  Urine culture     Status: Abnormal (Preliminary result)   Collection Time: 03/21/16 11:40 AM  Result Value Ref Range Status   Specimen Description URINE, CLEAN CATCH  Final   Special Requests NONE  Final   Culture >=100,000 COLONIES/mL GRAM NEGATIVE RODS (A)  Final   Report Status PENDING  Incomplete  Blood Culture (routine x 2)     Status: None (Preliminary result)   Collection Time: 03/21/16 12:27 PM  Result Value Ref Range Status   Specimen Description BLOOD RIGHT ANTECUBITAL  Final   Special Requests IN PEDIATRIC BOTTLE 3ML  Final   Culture   Final    NO GROWTH < 24 HOURS Performed at [facility], [address] [place], [place] [address]    Report Status PENDING  Incomplete     Time coordinating discharge: Over 30 minutes  SIGNED:   Calvert CantorIZWAN,Recia Sons, MD  [facility] 03/22/2016, 1:45 PM Pager   If 7PM-7AM, please contact night-coverage [email or web address] Password [email or web address]

## 2016-03-22 NOTE — Progress Notes (Signed)
Discharge instructions discussed with patient, verbalized agreement and understanding, prescription given to patient 

## 2016-03-23 LAB — URINE CULTURE

## 2016-03-23 LAB — HIV ANTIBODY (ROUTINE TESTING W REFLEX): HIV Screen 4th Generation wRfx: NONREACTIVE

## 2016-03-26 LAB — CULTURE, BLOOD (ROUTINE X 2)
CULTURE: NO GROWTH
Culture: NO GROWTH

## 2017-02-21 ENCOUNTER — Other Ambulatory Visit: Payer: Self-pay | Admitting: Family Medicine

## 2017-02-21 ENCOUNTER — Other Ambulatory Visit (HOSPITAL_COMMUNITY)
Admission: RE | Admit: 2017-02-21 | Discharge: 2017-02-21 | Disposition: A | Discharge status: Home / Self Care | Payer: BLUE CROSS/BLUE SHIELD | Source: Ambulatory Visit | Attending: Family Medicine | Admitting: Family Medicine

## 2017-02-21 DIAGNOSIS — Z124 Encounter for screening for malignant neoplasm of cervix: Secondary | ICD-10-CM | POA: Insufficient documentation

## 2017-02-22 LAB — CYTOLOGY - PAP
Diagnosis: NEGATIVE
HPV: NOT DETECTED

## 2017-10-05 ENCOUNTER — Emergency Department (HOSPITAL_COMMUNITY)
Admission: EM | Admit: 2017-10-05 | Discharge: 2017-10-05 | Disposition: A | Discharge status: Home / Self Care | Payer: BLUE CROSS/BLUE SHIELD | Attending: Emergency Medicine | Admitting: Emergency Medicine

## 2017-10-05 ENCOUNTER — Emergency Department (HOSPITAL_COMMUNITY): Payer: BLUE CROSS/BLUE SHIELD

## 2017-10-05 DIAGNOSIS — S0081XA Abrasion of other part of head, initial encounter: Secondary | ICD-10-CM | POA: Diagnosis not present

## 2017-10-05 DIAGNOSIS — W1830XA Fall on same level, unspecified, initial encounter: Secondary | ICD-10-CM | POA: Diagnosis not present

## 2017-10-05 DIAGNOSIS — I10 Essential (primary) hypertension: Secondary | ICD-10-CM | POA: Insufficient documentation

## 2017-10-05 DIAGNOSIS — Y999 Unspecified external cause status: Secondary | ICD-10-CM | POA: Insufficient documentation

## 2017-10-05 DIAGNOSIS — Y92017 Garden or yard in single-family (private) house as the place of occurrence of the external cause: Secondary | ICD-10-CM | POA: Insufficient documentation

## 2017-10-05 DIAGNOSIS — Y9301 Activity, walking, marching and hiking: Secondary | ICD-10-CM | POA: Insufficient documentation

## 2017-10-05 DIAGNOSIS — W19XXXA Unspecified fall, initial encounter: Secondary | ICD-10-CM

## 2017-10-05 MED ORDER — ONDANSETRON 4 MG PO TBDP
4.0000 mg | ORAL_TABLET | Freq: Once | ORAL | Status: AC
  Administered 2017-10-05: 4 mg via ORAL
  Filled 2017-10-05: qty 1

## 2017-10-05 MED ORDER — OXYCODONE-ACETAMINOPHEN 5-325 MG PO TABS
1.0000 | ORAL_TABLET | Freq: Once | ORAL | Status: AC
  Administered 2017-10-05: 1 via ORAL
  Filled 2017-10-05: qty 1

## 2017-10-05 MED ORDER — CLONIDINE HCL 0.1 MG PO TABS
0.1000 mg | ORAL_TABLET | Freq: Once | ORAL | Status: AC
  Administered 2017-10-05: 0.1 mg via ORAL
  Filled 2017-10-05: qty 1

## 2017-10-05 NOTE — ED Provider Notes (Signed)
[facility]-EMERGENCY DEPT Provider Note  CSN: [id number] Arrival date & time: 10/05/17  1830  History   Chief Complaint Chief Complaint  Patient presents with  . Fall  . Jaw Pain  . Back Pain    HPI  A medical interpreter was used for this encounter.  Valerie Houston is a 57 y.o. female with a medical history of HTN who presented to the ED following a fall. Patient states that she tripped over a metal piping outside her home and fell on the ground and hit her chin. Denies any preceding symptoms to fall such as vision changes, chest pain, SOB, palpitations, paresthesias, weakness, dizziness/lightheadedness or syncope. Currently endorses chin, jaw and bilateral ear pain. Denies trismus, otorrhea/bleeding or neck pain. Denies hitting her head, LOC, AMS/confusion or vision changes. Patient did not have any intervention for pain prior to arrival.  Past Medical History:  Diagnosis Date  . Hypertension     Patient Active Problem List   Diagnosis Date Noted  . Sepsis (HCC) 03/22/2016  . Pyelonephritis 03/21/2016    Past Surgical History:  Procedure Laterality Date  . EYE SURGERY       OB History   None      Home Medications    Prior to Admission medications   Medication Sig Start Date End Date Taking? Authorizing Provider  cefpodoxime (VANTIN) 200 MG tablet Take 1 tablet (200 mg total) by mouth 2 (two) times daily. 03/22/16   Calvert Cantor, MD  lisinopril (PRINIVIL,ZESTRIL) 20 MG tablet Take 1 tablet (20 mg total) by mouth daily. 04/26/13   Cathren Laine, MD    Family History No family history on file.  Social History Social History   Tobacco Use  . Smoking status: Never Smoker  . Smokeless tobacco: Never Used  Substance Use Topics  . Alcohol use: No  . Drug use: No     Allergies   Patient has no known allergies.   Review of Systems Review of Systems  Constitutional: Negative.   HENT: Positive for ear pain. Negative for ear discharge.     Eyes: Negative for visual disturbance.  Musculoskeletal:       Chin and jaw pain.  Skin: Positive for wound.  Neurological: Negative.  Negative for dizziness, speech difficulty, weakness, light-headedness, numbness and headaches.   Physical Exam Updated Vital Signs BP (!) 206/104 (BP Location: Left Arm)   Pulse 74   Temp 98.9 F (37.2 C) (Oral)   Resp 18   SpO2 98%   Physical Exam  Constitutional: She is oriented to person, place, and time. She appears well-developed and well-nourished.  Sitting comfortably in chair.  HENT:  Head: Normocephalic and atraumatic.    Mouth/Throat: Uvula is midline, oropharynx is clear and moist and mucous membranes are normal. No trismus in the jaw.  Superficial abrasion to chin with tenderness over this area and along mandible. Normal TMJ ROM. No dental injury or loosening. No injury to buccal or oral mucosa.  Eyes: Pupils are equal, round, and reactive to light. Conjunctivae, EOM and lids are normal.  Neck: Trachea normal, normal range of motion, full passive range of motion without pain and phonation normal. Neck supple. No spinous process tenderness and no muscular tenderness present. Normal range of motion present.  Cardiovascular: Normal rate, regular rhythm, normal heart sounds, intact distal pulses and normal pulses.  Pulmonary/Chest: Effort normal and breath sounds normal.  Neurological: She is alert and oriented to person, place, and time. No cranial nerve deficit.  Skin: Skin is warm. Capillary refill takes less than 2 seconds. Abrasion noted.  Nursing note and vitals reviewed.  ED Treatments / Results  Labs (all labs ordered are listed, but only abnormal results are displayed) Labs Reviewed - No data to display  EKG None  Radiology Ct Maxillofacial Wo Contrast  Result Date: 10/05/2017 CLINICAL DATA:  Fall, mandible pain, left anterior mandibular soft tissue injury EXAM: CT MAXILLOFACIAL WITHOUT CONTRAST TECHNIQUE: Multidetector CT  imaging of the maxillofacial structures was performed. Multiplanar CT image reconstructions were also generated. COMPARISON:  None. FINDINGS: Osseous: No fracture or mandibular dislocation. No destructive process. Orbits: Negative. No traumatic or inflammatory finding. Sinuses: Clear. Soft tissues: Focal left anterior mandibular soft tissue swelling/hematoma without underlying mandible fracture. Limited intracranial: No significant or unexpected finding. IMPRESSION: Intact facial bones. Negative for fracture. Left anterior mandibular region soft tissue swelling/bruising. Electronically Signed   By: Judie Petit.  Shick M.D.   On: 10/05/2017 20:26    Procedures Procedures (including critical care time)  Medications Ordered in ED Medications  oxyCODONE-acetaminophen (PERCOCET/ROXICET) 5-325 MG per tablet 1 tablet (has no administration in time range)  ondansetron (ZOFRAN-ODT) disintegrating tablet 4 mg (has no administration in time range)     Initial Impression / Assessment and Plan / ED Course  Triage vital signs and the nursing notes have been reviewed.  Pertinent labs & imaging results that were available during care of the patient were reviewed and considered in medical decision making (see chart for details).  Patient presents to the ED following a mechanical fall where she landed on her chin and has associated abrasion and swelling. There are no other facial injuries or areas of bony tenderness. Will obtain CT max to further evaluate for facial injuries. Remaining physical exam is normal.  Clinical Course as of Oct 05 2124  Fri Oct 05, 2017  2034 CT max resulted. No fractures or dislocations seen in jaw or other structures of the face.   [GM]  2039 Patient hypertensive in triage at 203/94. Elevation can be partially due to pain. She carries a diagnosis of HTN and has past Rx for lisinopril. However, pt states that she is not on any BP medication. No s/s of end organ damage. Will have BP re-checked  manually.   [GM]  2042 Repeat BP 200/98. Will give 1 dose of clonidine prior to discharge. Advised patient to follow-up with PCP regarding BP medications. She reports going without lisinopril for at least 3 years.   [GM]  2124 Manual BP prior to discharge 180/100. SBP decreasing appropriately with clonidine. Will proceed with discharge and appropriate follow-up with PCP.   [GM]    Clinical Course User Index [GM] Saaya Procell, Sharyon Medicus, PA-C   Final Clinical Impressions(s) / ED Diagnoses  1. Fall. Education provided on OTC and supportive treatment for pain relief. 2. Hypertension. Clonidine 0.1mg  given in the ED and BP responded appropriately. Advised to follow-up with PCP regarding anti-hypertensive.  Dispo: Home. After thorough clinical evaluation, this patient is determined to be medically stable and can be safely discharged with the previously mentioned treatment and/or outpatient follow-up/referral(s). At this time, there are no other apparent medical conditions that require further screening, evaluation or treatment.   Final diagnoses:  Fall, initial encounter  Essential hypertension    ED Discharge Orders    None        Reva Bores 10/05/17 2126    Jacalyn Lefevre, MD 10/05/17 2214

## 2017-10-05 NOTE — Discharge Instructions (Addendum)
Your face imaging shows that you do not have any broken bones which is good. For pain, you can use Tylenol and/or Ibuprofen. I also recommend that you put ice on your chin to help with swelling.  Your blood pressure was elevated today. I have given you medication today to help lower it. It is very important that you follow-up with your PCP about this. You were on a blood pressure medication in the past and you may need to go back on one.  Thank you for allowing [place] to take care of you today. Please be safe.

## 2017-10-05 NOTE — ED Triage Notes (Signed)
Patient here from home with complaints of fall today after stepping into hole. Jaw pain, left side abrasion, bleeding control. Back pain. Ambulatory.

## 2017-11-19 ENCOUNTER — Other Ambulatory Visit: Payer: Self-pay | Admitting: Family Medicine

## 2017-11-19 DIAGNOSIS — Z1231 Encounter for screening mammogram for malignant neoplasm of breast: Secondary | ICD-10-CM

## 2017-11-22 ENCOUNTER — Ambulatory Visit: Payer: BLUE CROSS/BLUE SHIELD

## 2017-11-28 ENCOUNTER — Ambulatory Visit: Payer: BLUE CROSS/BLUE SHIELD

## 2019-07-06 IMAGING — CT CT MAXILLOFACIAL W/O CM
3 series · 16 of 47 positions shown, 19 images · non-contrast
Comparison: None.

CLINICAL DATA: Fall, mandible pain, left anterior mandibular soft
tissue injury

EXAM:
CT MAXILLOFACIAL WITHOUT CONTRAST
TECHNIQUE: Multidetector CT imaging of the maxillofacial structures was
performed. Multiplanar CT image reconstructions were also generated.

[Series 3: max soft · axial · 0.35mm/px · z∈[+1364,+1500]mm · 10 of 80 slices shown, 13 images]
[im 6/80  brain]
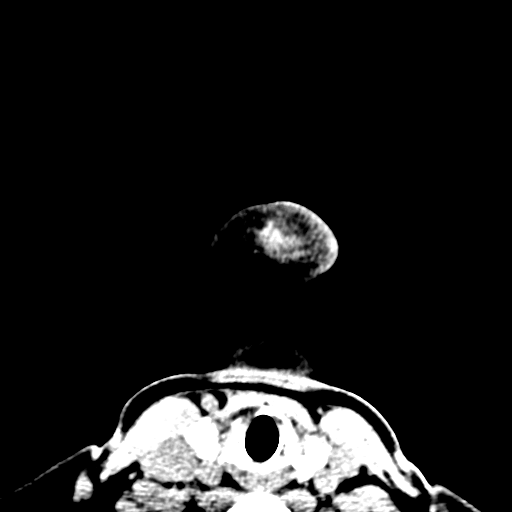
[im 6/80  bone]
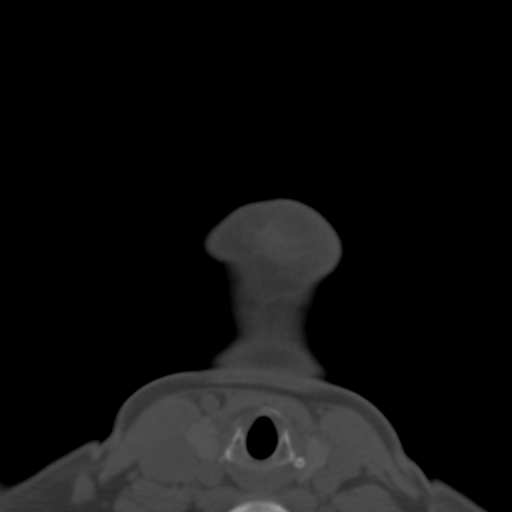
[im 14/80  bone]
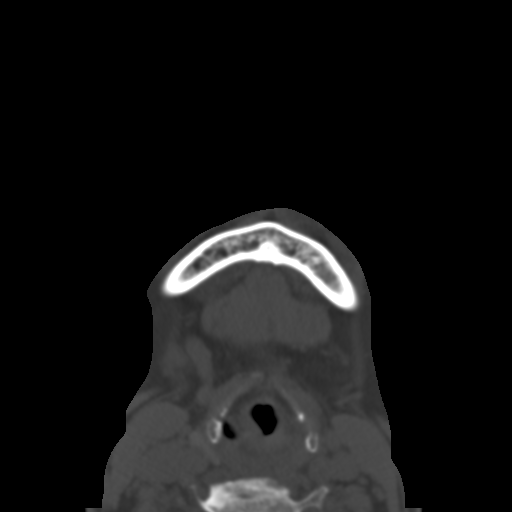
[im 22/80  bone]
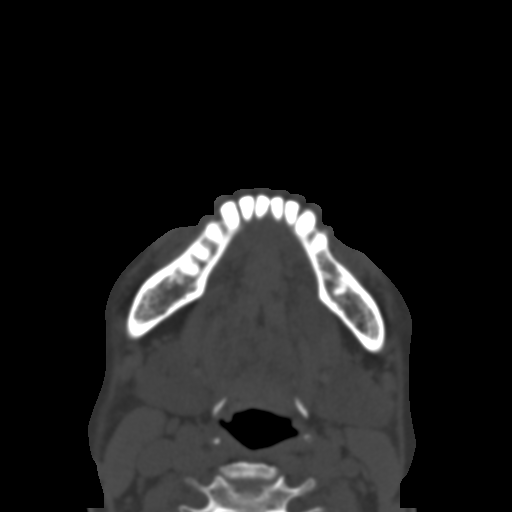
[im 28/80  bone]
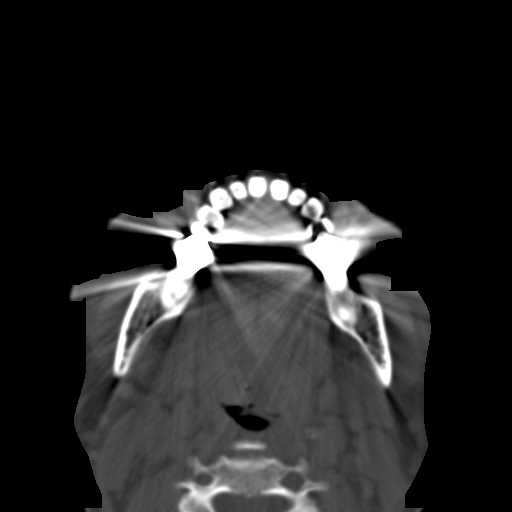
[im 36/80  brain]
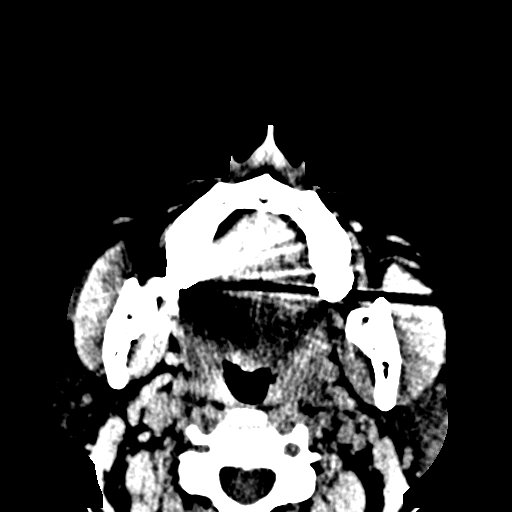
[im 36/80  bone]
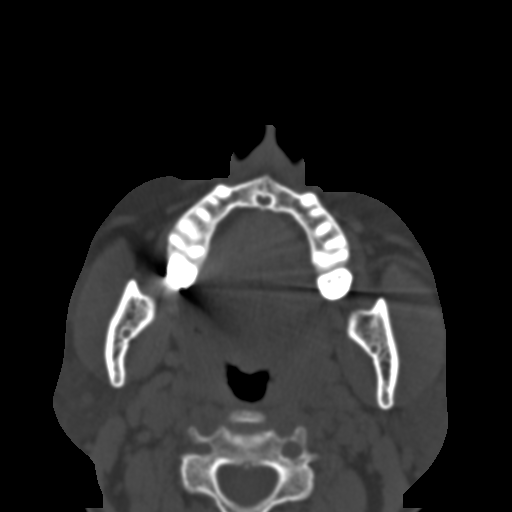
[im 44/80  bone]
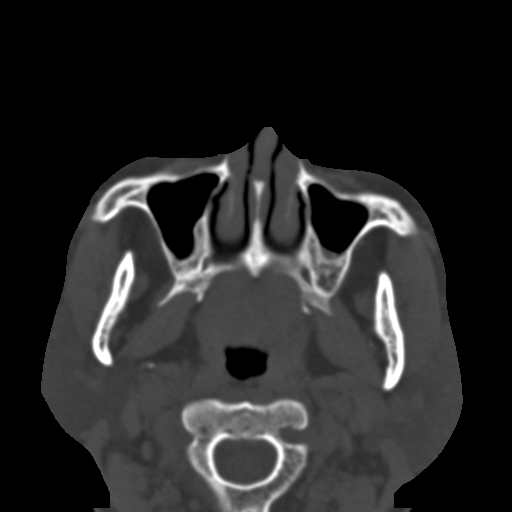
[im 52/80  bone]
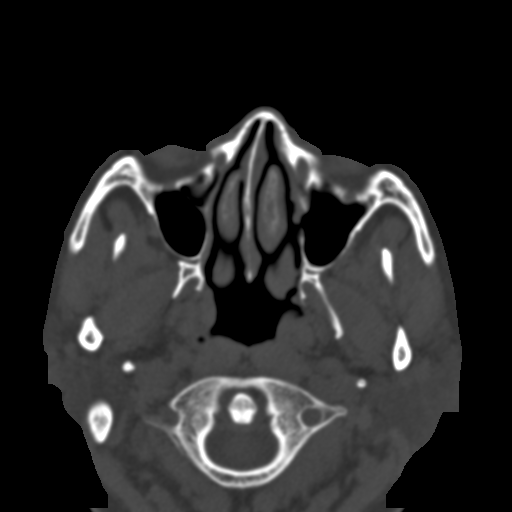
[im 60/80  bone]
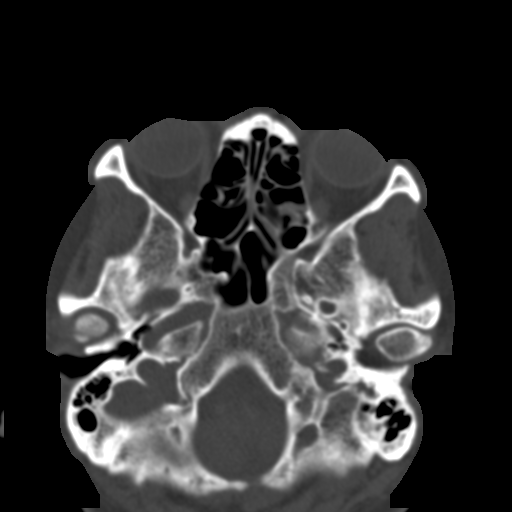
[im 66/80  brain]
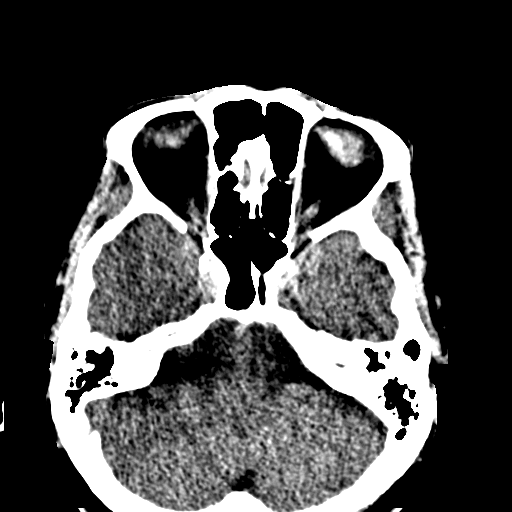
[im 66/80  bone]
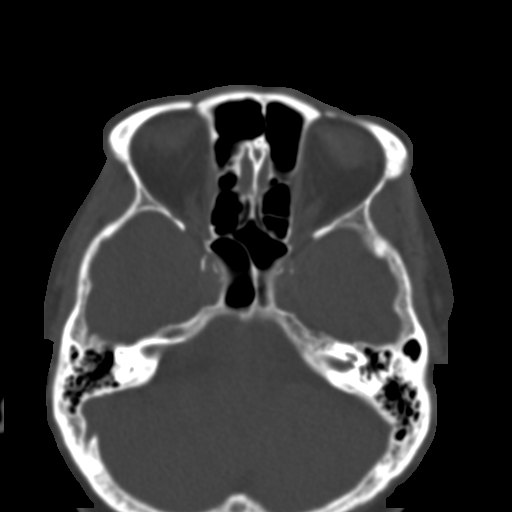
[im 74/80  bone]
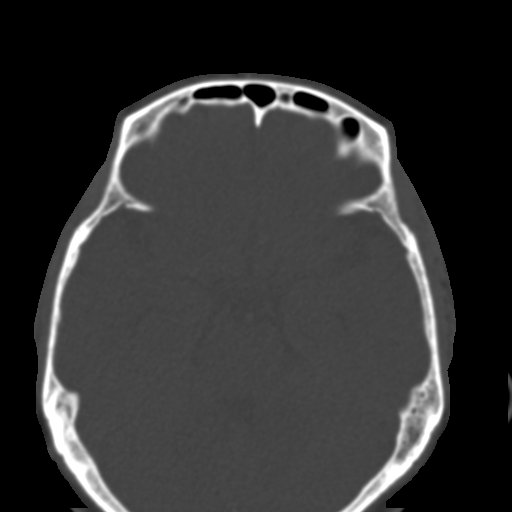

[Series 7: coronal soft · coronal · 0.36mm/px · 3 of 72 slices shown]
[im 24/72  bone]
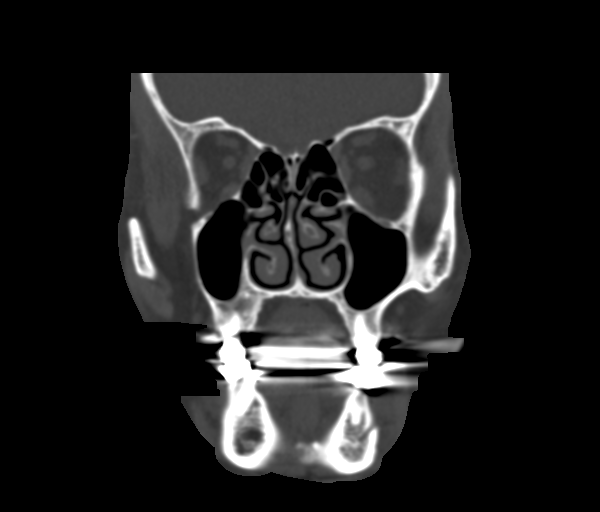
[im 32/72  bone]
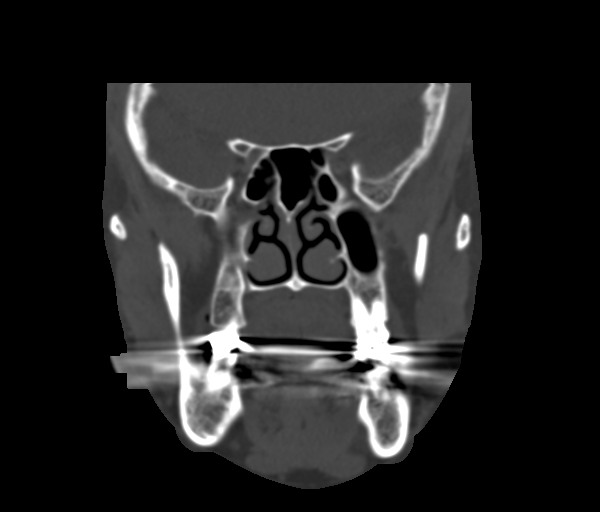
[im 40/72  bone]
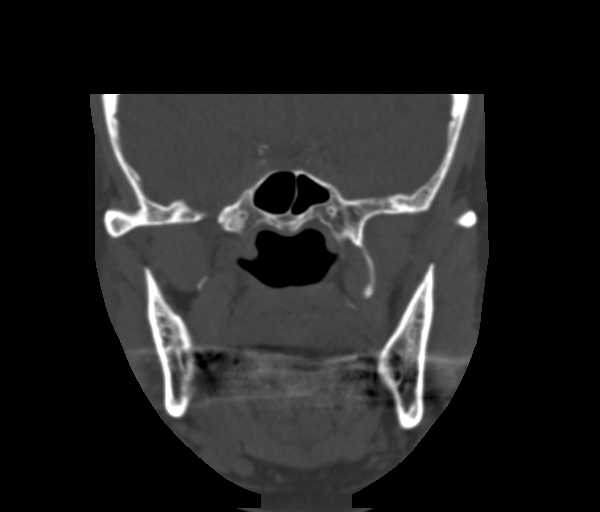

[Series 9: sagittal soft · sagittal · 0.32mm/px · 3 of 88 slices shown]
[im 30/88  bone]
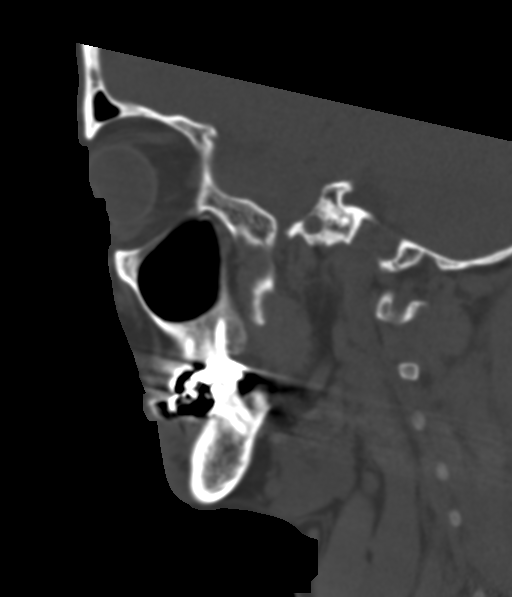
[im 44/88  bone]
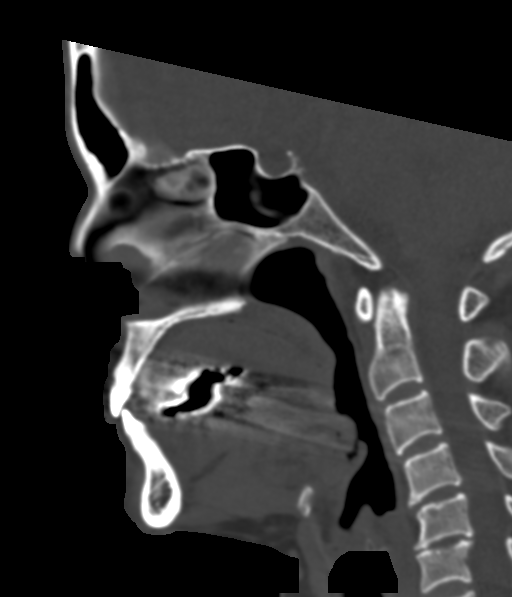
[im 59/88  bone]
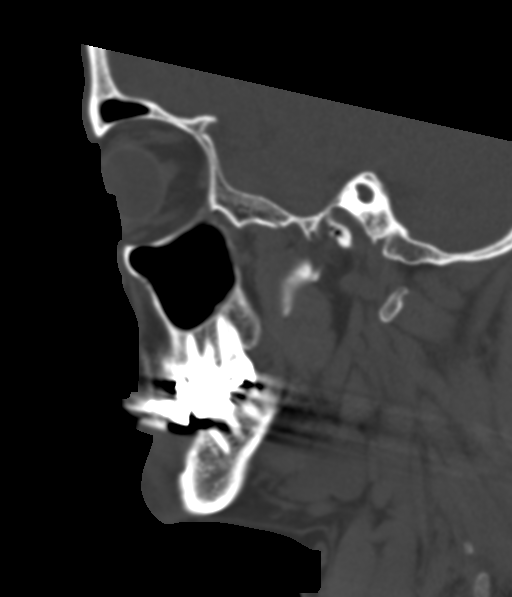

[16 of 47 positions shown; findings below may reference images not displayed]

FINDINGS: Osseous: No fracture or mandibular dislocation. No destructive
process.

Orbits: Negative. No traumatic or inflammatory finding.

Sinuses: Clear.

Soft tissues: Focal left anterior mandibular soft tissue
swelling/hematoma without underlying mandible fracture.

Limited intracranial: No significant or unexpected finding.
IMPRESSION: Intact facial bones. Negative for fracture. Left anterior mandibular
region soft tissue swelling/bruising.

## 2021-03-03 ENCOUNTER — Other Ambulatory Visit: Payer: Self-pay | Admitting: Gastroenterology

## 2021-03-03 DIAGNOSIS — R1011 Right upper quadrant pain: Secondary | ICD-10-CM

## 2021-12-11 ENCOUNTER — Ambulatory Visit
Admission: EM | Admit: 2021-12-11 | Discharge: 2021-12-11 | Disposition: A | Discharge status: Home / Self Care | Payer: Commercial Managed Care - HMO | Attending: Internal Medicine | Admitting: Internal Medicine

## 2021-12-11 DIAGNOSIS — R0789 Other chest pain: Secondary | ICD-10-CM | POA: Insufficient documentation

## 2021-12-11 DIAGNOSIS — R350 Frequency of micturition: Secondary | ICD-10-CM | POA: Insufficient documentation

## 2021-12-11 DIAGNOSIS — N3000 Acute cystitis without hematuria: Secondary | ICD-10-CM | POA: Diagnosis not present

## 2021-12-11 HISTORY — DX: Pure hypercholesterolemia, unspecified: E78.00

## 2021-12-11 LAB — POCT URINALYSIS DIP (MANUAL ENTRY)
Bilirubin, UA: NEGATIVE
Glucose, UA: NEGATIVE mg/dL
Ketones, POC UA: NEGATIVE mg/dL
Nitrite, UA: POSITIVE — AB
Protein Ur, POC: NEGATIVE mg/dL
Spec Grav, UA: 1.01 (ref 1.010–1.025)
Urobilinogen, UA: 0.2 E.U./dL
pH, UA: 7 (ref 5.0–8.0)

## 2021-12-11 MED ORDER — NITROFURANTOIN MONOHYD MACRO 100 MG PO CAPS: 100.0000 mg | ORAL_CAPSULE | Freq: Two times a day (BID) | ORAL | 0 refills | Status: AC

## 2021-12-11 MED ORDER — KETOROLAC TROMETHAMINE 30 MG/ML IJ SOLN
30.0000 mg | Freq: Once | INTRAMUSCULAR | Status: AC
  Administered 2021-12-11: 30 mg via INTRAMUSCULAR

## 2021-12-11 MED ORDER — NAPROXEN 500 MG PO TABS: 500.0000 mg | ORAL_TABLET | Freq: Two times a day (BID) | ORAL | 0 refills | Status: AC

## 2021-12-11 NOTE — ED Provider Notes (Signed)
[facility]    CSN: [id number] Arrival date & time: 12/11/21  2836      History   Chief Complaint Chief Complaint  Patient presents with   Chest Pain    HPI Valerie Houston is a 61 y.o. female.   Patient's primary language is [place]; translation by daughter at bedside   Subjective:  Valerie Houston is a 61 y.o. female who presents for evaluation of chest pain. Onset was acute around 6 AM this morning. Symptoms have been unchanged since that time. The patient describes the pain as sharp and it does not radiate. Patient rates pain as a 7/10 in intensity. Patient denies any associating symptoms are. Movement and palpation of the chest seems to aggravate the pain. Leaning backwards seems to alleviate the pain some. Patient's cardiac risk factors include dyslipidemia and hypertension. She has no risk factors for DVT/PE. No previous cardiac testing. Patient took a motrin without much imporvement in the pain. Notably, the patient went to [employer] 1 day ago and picked up a 40 pack of water. She also was playing with her 89 year old granddaughter who jumped on her chest a few times as well.   Additionally, Valerie Houston also reports frequency and incomplete bladder emptying for several days. Patient denies back pain, fever, dysuria or vaginal discharge.  Patient does not have a history of recurrent UTI.  Patient does not have a history of pyelonephritis.  The following portions of the patient's history were reviewed and updated as appropriate: allergies, current medications, past family history, past medical history, past social history, past surgical history, and problem list.       Past Medical History:  Diagnosis Date   High cholesterol    Hypertension     Patient Active Problem List   Diagnosis Date Noted   Sepsis (HCC) 03/22/2016   Pyelonephritis 03/21/2016    Past Surgical History:  Procedure Laterality Date   EYE SURGERY      OB History   No obstetric history on file.       Home Medications    Prior to Admission medications   Medication Sig Start Date End Date Taking? Authorizing Provider  naproxen (NAPROSYN) 500 MG tablet Take 1 tablet (500 mg total) by mouth 2 (two) times daily. Take with food 12/11/21  Yes Maidie Streight, [place], FNP  nitrofurantoin, macrocrystal-monohydrate, (MACROBID) 100 MG capsule Take 1 capsule (100 mg total) by mouth 2 (two) times daily. 12/11/21  Yes Lurline Idol, FNP  lisinopril (PRINIVIL,ZESTRIL) 20 MG tablet Take 1 tablet (20 mg total) by mouth daily. 04/26/13   Cathren Laine, MD    Family History No family history on file.  Social History Social History   Tobacco Use   Smoking status: Never   Smokeless tobacco: Never  Vaping Use   Vaping Use: Never used  Substance Use Topics   Alcohol use: No   Drug use: No     Allergies   Patient has no known allergies.   Review of Systems Review of Systems  Constitutional:  Negative for diaphoresis.  Respiratory:  Negative for shortness of breath.   Cardiovascular:  Positive for chest pain. Negative for palpitations and leg swelling.  Gastrointestinal:  Negative for nausea and vomiting.  Genitourinary:  Positive for frequency. Negative for dysuria.  Musculoskeletal:  Negative for back pain.  Neurological:  Negative for dizziness and headaches.  All other systems reviewed and are negative.    Physical Exam Triage Vital Signs ED Triage Vitals  Enc Vitals  Group     BP 12/11/21 1007 136/76     Pulse Rate 12/11/21 1007 71     Resp 12/11/21 1007 20     Temp 12/11/21 1007 98 F (36.7 C)     Temp Source 12/11/21 1007 Oral     SpO2 12/11/21 1007 96 %     Weight --      Height --      Head Circumference --      Peak Flow --      Pain Score 12/11/21 1008 7     Pain Loc --      Pain Edu? --      Excl. in GC? --    No data found.  Updated Vital Signs BP 136/76 (BP Location: Right Arm)   Pulse 71   Temp 98 F (36.7 C) (Oral)   Resp 20   SpO2 96%   Visual  Acuity Right Eye Distance:   Left Eye Distance:   Bilateral Distance:    Right Eye Near:   Left Eye Near:    Bilateral Near:     Physical Exam Vitals reviewed.  Constitutional:      General: She is not in acute distress.    Appearance: She is well-developed. She is not ill-appearing, toxic-appearing or diaphoretic.  HENT:     Head: Normocephalic.  Cardiovascular:     Rate and Rhythm: Normal rate and regular rhythm.     Heart sounds: Normal heart sounds.  Pulmonary:     Effort: Pulmonary effort is normal.     Breath sounds: Normal breath sounds.  Chest:     Chest wall: Tenderness present.  Abdominal:     Palpations: Abdomen is soft.     Tenderness: There is no abdominal tenderness.  Musculoskeletal:        General: Normal range of motion.     Cervical back: Normal range of motion and neck supple.  Skin:    General: Skin is warm and dry.  Neurological:     General: No focal deficit present.     Mental Status: She is alert and oriented to person, place, and time.  Psychiatric:        Mood and Affect: Mood normal.        Behavior: Behavior normal.      UC Treatments / Results  Labs (all labs ordered are listed, but only abnormal results are displayed) Labs Reviewed  POCT URINALYSIS DIP (MANUAL ENTRY) - Abnormal; Notable for the following components:      Result Value   Blood, UA trace-intact (*)    Nitrite, UA Positive (*)    Leukocytes, UA Small (1+) (*)    All other components within normal limits  URINE CULTURE    EKG   Radiology No results found.  Procedures ED EKG  Date/Time: 12/11/2021 10:40 AM  Performed by: Lurline Idol, FNP Authorized by: Merrilee Jansky, MD   ECG reviewed by ED Physician in the absence of a cardiologist: yes   Previous ECG:    Previous ECG:  Unavailable Interpretation:    Interpretation: normal   Rate:    ECG rate:  72   ECG rate assessment: normal   Rhythm:    Rhythm: sinus rhythm   Ectopy:    Ectopy: none    QRS:    QRS axis:  Normal   QRS intervals:  Normal   QRS conduction: normal   ST segments:    ST segments:  Normal T waves:  T waves: normal    (including critical care time)  Medications Ordered in UC Medications  ketorolac (TORADOL) 30 MG/ML injection 30 mg (30 mg Intramuscular Given 12/11/21 1035)    Initial Impression / Assessment and Plan / UC Course  I have reviewed the triage vital signs and the nursing notes.  Pertinent labs & imaging results that were available during my care of the patient were reviewed by me and considered in my medical decision making (see chart for details).    61 year old female presenting with acute chest pain.  Pain is worse with movement and palpation of the chest.  She picked up a large pack of water 1 day ago was also playing with her 65-year-old granddaughter jumped on her chest a few times.  Patient is alert and oriented.  Afebrile.  Nontoxic.  Small tenderness is present and reproducible.  EKG unremarkable.  Low suspicion for cardiac etiology.  Patient given an injection of Toradol in the clinic.  Prescribed naproxen.  Discussed supportive care measures and indications for immediate follow-up with patient and her daughter.  Additionally, patient presents with urinary frequency.  Urinalysis positive for leukocytes and nitrites.  Patient prescribed Macrobid.  Urine cultures pending.  Today's evaluation has revealed no signs of a dangerous process. Discussed diagnosis with patient and/or guardian. Patient and/or guardian aware of their diagnosis, possible red flag symptoms to watch out for and need for close follow up. Patient and/or guardian understands verbal and written discharge instructions. Patient and/or guardian comfortable with plan and disposition.  Patient and/or guardian has a clear mental status at this time, good insight into illness (after discussion and teaching) and has clear judgment to make decisions regarding their  care  Documentation was completed with the aid of voice recognition software. Transcription may contain typographical errors. Final Clinical Impressions(s) / UC Diagnoses   Final diagnoses:  Urinary frequency  Chest wall pain  Acute cystitis without hematuria     Discharge Instructions      Chest wall pain is pain in or around the bones and muscles of your chest. Sometimes, an injury causes this pain. Excessive coughing or overuse of arm and chest muscles may also cause chest wall pain. It was likely picking up the heavy water and playing with your granddaughter that caused your pain. You EKG was normal and your heart appears to be ok. Take medications as prescribed. Do not take any additional motrin, ibuprofen, aspirin or aleve with this medication. Apply heat and/or ice to the chest 3-4 times a day. Avoid activities that cause pain. Do not lift anything heavy if possible. Follow-up with your primary care if symptoms don't improve over the next week. Go to the ED immediately if your pain gets worse or you begin to develop any other concerning symptoms.   Your urine test indicates an infection. Take antibiotics as prescribed and drink plenty of fluids.      ED Prescriptions     Medication Sig Dispense Auth. Provider   naproxen (NAPROSYN) 500 MG tablet Take 1 tablet (500 mg total) by mouth 2 (two) times daily. Take with food 14 tablet Wynter Isaacs, [place], FNP   nitrofurantoin, macrocrystal-monohydrate, (MACROBID) 100 MG capsule Take 1 capsule (100 mg total) by mouth 2 (two) times daily. 10 capsule Lurline Idol, FNP      PDMP not reviewed this encounter.   Lurline Idol, [place] 12/11/21 1702

## 2021-12-11 NOTE — Discharge Instructions (Addendum)
Chest wall pain is pain in or around the bones and muscles of your chest. Sometimes, an injury causes this pain. Excessive coughing or overuse of arm and chest muscles may also cause chest wall pain. It was likely picking up the heavy water and playing with your granddaughter that caused your pain. You EKG was normal and your heart appears to be ok. Take medications as prescribed. Do not take any additional motrin, ibuprofen, aspirin or aleve with this medication. Apply heat and/or ice to the chest 3-4 times a day. Avoid activities that cause pain. Do not lift anything heavy if possible. Follow-up with your primary care if symptoms don't improve over the next week. Go to the ED immediately if your pain gets worse or you begin to develop any other concerning symptoms.   Your urine test indicates an infection. Take antibiotics as prescribed and drink plenty of fluids.

## 2021-12-11 NOTE — ED Triage Notes (Signed)
Pt c/o CP, sweating started 6am-denies fever/flu sx-daughter interpreting-NAD-steady gait

## 2021-12-13 LAB — URINE CULTURE
Culture: >=100000 — AB
Special Requests: NORMAL

## 2022-11-11 ENCOUNTER — Ambulatory Visit
Admission: EM | Admit: 2022-11-11 | Discharge: 2022-11-11 | Disposition: A | Discharge status: Home / Self Care | Payer: Medicaid Other | Attending: Internal Medicine | Admitting: Internal Medicine

## 2022-11-11 DIAGNOSIS — Z87448 Personal history of other diseases of urinary system: Secondary | ICD-10-CM | POA: Diagnosis present

## 2022-11-11 DIAGNOSIS — M25511 Pain in right shoulder: Secondary | ICD-10-CM | POA: Insufficient documentation

## 2022-11-11 DIAGNOSIS — M545 Low back pain, unspecified: Secondary | ICD-10-CM | POA: Insufficient documentation

## 2022-11-11 DIAGNOSIS — M25512 Pain in left shoulder: Secondary | ICD-10-CM | POA: Insufficient documentation

## 2022-11-11 DIAGNOSIS — R300 Dysuria: Secondary | ICD-10-CM | POA: Diagnosis present

## 2022-11-11 DIAGNOSIS — M542 Cervicalgia: Secondary | ICD-10-CM | POA: Insufficient documentation

## 2022-11-11 LAB — POCT URINALYSIS DIP (MANUAL ENTRY)
Bilirubin, UA: NEGATIVE
Glucose, UA: NEGATIVE mg/dL
Ketones, POC UA: NEGATIVE mg/dL
Leukocytes, UA: NEGATIVE
Nitrite, UA: NEGATIVE
Protein Ur, POC: NEGATIVE mg/dL
Spec Grav, UA: 1.02 (ref 1.010–1.025)
Urobilinogen, UA: 0.2 U/dL
pH, UA: 7 (ref 5.0–8.0)

## 2022-11-11 MED ORDER — IBUPROFEN 600 MG PO TABS: 600.0000 mg | ORAL_TABLET | Freq: Four times a day (QID) | ORAL | 0 refills | Status: AC | PRN

## 2022-11-11 MED ORDER — CYCLOBENZAPRINE HCL 5 MG PO TABS: 5.0000 mg | ORAL_TABLET | Freq: Every evening | ORAL | 0 refills | Status: AC | PRN

## 2022-11-11 NOTE — ED Provider Notes (Signed)
[facility]  Note:  This document was prepared using Conservation officer, historic buildings and may include unintentional dictation errors.  MRN: [id number] DOB: 1960/08/01  Subjective:   Valerie Houston is a 62 y.o. female presenting for 1 month history of persistent intermittent back pains, low back pains, neck and shoulder pains. Also has urinary complaints. Over night she tries to urinate 3 times. Has to urinate about 10 times during the day. Sometimes this makes her nauseous. Hydrates very well with 6 bottles of water. Denies fever, n/v, vaginal discharge, hematuria. Has a history of pyelonephritis but is remote. Patient drinks Sprite daily as well.  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  Reports that she is short staffed and therefore cannot cut back on her hours.  No current facility-administered medications for this encounter.  Current Outpatient Medications:    amLODipine (NORVASC) 10 MG tablet, Take 1 tablet by mouth once daily for 90 days for 90 days, Disp: , Rfl:    atorvastatin (LIPITOR) 10 MG tablet, Take 1 tablet by mouth daily., Disp: , Rfl:    famotidine (PEPCID) 20 MG tablet, 1 tablet at bedtime as needed Orally Once a day for 90 days, Disp: , Rfl:    pantoprazole (PROTONIX) 20 MG tablet, 1 tablet 1/2 to 1 hour before morning meal Orally Once a day for 90 days, Disp: , Rfl:    lisinopril (PRINIVIL,ZESTRIL) 20 MG tablet, Take 1 tablet (20 mg total) by mouth daily., Disp: 30 tablet, Rfl: 0   naproxen (NAPROSYN) 500 MG tablet, Take 1 tablet (500 mg total) by mouth 2 (two) times daily. Take with food, Disp: 14 tablet, Rfl: 0   nitrofurantoin, macrocrystal-monohydrate, (MACROBID) 100 MG capsule, Take 1 capsule (100 mg total) by mouth 2 (two) times daily., Disp: 10 capsule, Rfl: 0   No Known Allergies  Past Medical History:  Diagnosis Date   High cholesterol    Hypertension      Past Surgical History:  Procedure Laterality  Date   EYE SURGERY      No family history on file.  Social History   Tobacco Use   Smoking status: Never   Smokeless tobacco: Never  Vaping Use   Vaping status: Never Used  Substance Use Topics   Alcohol use: No   Drug use: No    ROS   Objective:   Vitals: BP 128/77 (BP Location: Left Arm)   Pulse 89   Temp 98.7 F (37.1 C) (Oral)   Resp 16   SpO2 95%   Physical Exam Constitutional:      General: She is not in acute distress.    Appearance: Normal appearance. She is well-developed. She is not ill-appearing, toxic-appearing or diaphoretic.  HENT:     Head: Normocephalic and atraumatic.     Nose: Nose normal.     Mouth/Throat:     Mouth: Mucous membranes are moist.  Eyes:     General: No scleral icterus.       Right eye: No discharge.        Left eye: No discharge.     Extraocular Movements: Extraocular movements intact.     Conjunctiva/sclera: Conjunctivae normal.  Cardiovascular:     Rate and Rhythm: Normal rate.  Pulmonary:     Effort: Pulmonary effort is normal.  Abdominal:     General: Bowel sounds are normal. There is no distension.     Palpations: Abdomen is soft. There is  no mass.     Tenderness: There is no abdominal tenderness. There is no right CVA tenderness, left CVA tenderness, guarding or rebound.  Musculoskeletal:     Lumbar back: No swelling, edema, deformity, signs of trauma, lacerations, spasms, tenderness or bony tenderness. Normal range of motion. Negative right straight leg raise test and negative left straight leg raise test. No scoliosis.  Skin:    General: Skin is warm and dry.  Neurological:     General: No focal deficit present.     Mental Status: She is alert and oriented to person, place, and time.  Psychiatric:        Mood and Affect: Mood normal.        Behavior: Behavior normal.        Thought Content: Thought content normal.        Judgment: Judgment normal.     Results for orders placed or performed during the  hospital encounter of 11/11/22 (from the past 24 hour(s))  POCT urinalysis dipstick     Status: Abnormal   Collection Time: 11/11/22  2:55 PM  Result Value Ref Range   Color, UA light yellow (A) yellow   Clarity, UA clear clear   Glucose, UA negative negative mg/dL   Bilirubin, UA negative negative   Ketones, POC UA negative negative mg/dL   Spec Grav, UA 1.610 9.604 - 1.025   Blood, UA trace-intact (A) negative   pH, UA 7.0 5.0 - 8.0   Protein Ur, POC negative negative mg/dL   Urobilinogen, UA 0.2 0.2 or 1.0 E.U./dL   Nitrite, UA Negative Negative   Leukocytes, UA Negative Negative    Assessment and Plan :   PDMP not reviewed this encounter.  1. Acute bilateral low back pain without sciatica   2. Dysuria   3. History of pyelonephritis   4. Acute pain of both shoulders   5. Neck pain    Urine culture pending. Avoid urinary irritants. Hydrate well with plain water. Will manage conservatively for back strain, inflammatory back pain due to overuse, her work schedule.  Encouraged patient to cut back her work schedule.  Use ibuprofen and cyclobenzaprine, rest and modification of physical activity.  Anticipatory guidance provided.  Counseled patient on potential for adverse effects with medications prescribed/recommended today, ER and return-to-clinic precautions discussed, patient verbalized understanding.    Wallis Bamberg, [place] 11/12/22 [phone]

## 2022-11-11 NOTE — Discharge Instructions (Addendum)
Use ibuprofen for pain and inflammation every 6 hours as needed. Use cyclobenzaprine as a muscle relaxant at bedtime for back spasms. Hydrate very well with at least 80 ounces of water. Try to work half your work schedule this week. Stop drinking Sprite. Lemon water is okay. Avoid energy drinks as well.

## 2022-11-11 NOTE — ED Triage Notes (Addendum)
Per daughter interpreting-pt c/o difficulty voiding, lower back pain x 1 month-was seen by PCP last week for multiple c/o but did not c/o of urinary sx-NAD-steady gait

## 2022-11-13 LAB — URINE CULTURE: Culture: <10000 — AB

## 2023-11-15 ENCOUNTER — Other Ambulatory Visit (HOSPITAL_BASED_OUTPATIENT_CLINIC_OR_DEPARTMENT_OTHER): Payer: Self-pay | Admitting: Family Medicine

## 2023-11-15 DIAGNOSIS — E2839 Other primary ovarian failure: Secondary | ICD-10-CM

## 2023-12-31 ENCOUNTER — Ambulatory Visit (HOSPITAL_BASED_OUTPATIENT_CLINIC_OR_DEPARTMENT_OTHER)
Admission: RE | Admit: 2023-12-31 | Discharge: 2023-12-31 | Disposition: A | Discharge status: Home / Self Care | Source: Ambulatory Visit | Attending: Family Medicine | Admitting: Family Medicine

## 2023-12-31 DIAGNOSIS — E2839 Other primary ovarian failure: Secondary | ICD-10-CM | POA: Diagnosis present
